# Patient Record
Sex: Female | Born: 1990 | Race: White | Hispanic: No | Marital: Married | Smoking: Former smoker
Health system: Southern US, Community
[De-identification: ages and names within clinical notes are randomized; demographics above are authoritative.]

## PROBLEM LIST (undated history)

## (undated) DIAGNOSIS — N914 Secondary oligomenorrhea: Secondary | ICD-10-CM

## (undated) DIAGNOSIS — N760 Acute vaginitis: Secondary | ICD-10-CM

## (undated) DIAGNOSIS — O24419 Gestational diabetes mellitus in pregnancy, unspecified control: Secondary | ICD-10-CM

## (undated) DIAGNOSIS — B9689 Other specified bacterial agents as the cause of diseases classified elsewhere: Secondary | ICD-10-CM

## (undated) HISTORY — DX: Gestational diabetes mellitus in pregnancy, unspecified control: O24.419

## (undated) HISTORY — PX: TONSILLECTOMY: SUR1361

## (undated) HISTORY — DX: Other specified bacterial agents as the cause of diseases classified elsewhere: B96.89

## (undated) HISTORY — DX: Other specified bacterial agents as the cause of diseases classified elsewhere: N76.0

## (undated) HISTORY — DX: Secondary oligomenorrhea: N91.4

---
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
## 2011-02-03 LAB — STREP A DNA PROBE

## 2011-02-03 LAB — RAPID STREP SCREEN (MED CTR MEBANE ONLY): Streptococcus, Group A Screen (Direct): NEGATIVE

## 2015-12-06 ENCOUNTER — Emergency Department (HOSPITAL_COMMUNITY)
Admission: EM | Admit: 2015-12-06 | Discharge: 2015-12-07 | Disposition: A | Discharge status: Home / Self Care | Payer: BLUE CROSS/BLUE SHIELD | Attending: Emergency Medicine | Admitting: Emergency Medicine

## 2015-12-06 ENCOUNTER — Encounter (HOSPITAL_COMMUNITY): Payer: Self-pay | Admitting: Emergency Medicine

## 2015-12-06 DIAGNOSIS — N390 Urinary tract infection, site not specified: Secondary | ICD-10-CM

## 2015-12-06 DIAGNOSIS — R55 Syncope and collapse: Secondary | ICD-10-CM | POA: Diagnosis not present

## 2015-12-06 DIAGNOSIS — Z5181 Encounter for therapeutic drug level monitoring: Secondary | ICD-10-CM | POA: Diagnosis not present

## 2015-12-06 LAB — URINALYSIS, ROUTINE W REFLEX MICROSCOPIC
BILIRUBIN URINE: NEGATIVE
GLUCOSE, UA: NEGATIVE mg/dL
KETONES UR: NEGATIVE mg/dL
LEUKOCYTES UA: NEGATIVE
Nitrite: POSITIVE — AB
PH: 6 (ref 5.0–8.0)
Protein, ur: NEGATIVE mg/dL
SPECIFIC GRAVITY, URINE: 1.025 (ref 1.005–1.030)

## 2015-12-06 LAB — BASIC METABOLIC PANEL
ANION GAP: 4 — AB (ref 5–15)
BUN: 15 mg/dL (ref 6–20)
CO2: 23 mmol/L (ref 22–32)
Calcium: 9 mg/dL (ref 8.9–10.3)
Chloride: 108 mmol/L (ref 101–111)
Creatinine, Ser: 0.77 mg/dL (ref 0.44–1.00)
GFR calc non Af Amer: >60 mL/min (ref >60)
Glucose, Bld: 94 mg/dL (ref 65–99)
Potassium: 3.5 mmol/L (ref 3.5–5.1)
SODIUM: 135 mmol/L (ref 135–145)

## 2015-12-06 LAB — RAPID URINE DRUG SCREEN, HOSP PERFORMED
AMPHETAMINES: NOT DETECTED
BENZODIAZEPINES: NOT DETECTED
Barbiturates: NOT DETECTED
Cocaine: NOT DETECTED
OPIATES: NOT DETECTED
TETRAHYDROCANNABINOL: POSITIVE — AB

## 2015-12-06 LAB — DIFFERENTIAL
Basophils Absolute: 0 10*3/uL (ref 0.0–0.1)
Basophils Relative: 0 %
EOS PCT: 1 %
Eosinophils Absolute: 0.1 10*3/uL (ref 0.0–0.7)
LYMPHS ABS: 2.7 10*3/uL (ref 0.7–4.0)
LYMPHS PCT: 34 %
Monocytes Absolute: 0.6 10*3/uL (ref 0.1–1.0)
Monocytes Relative: 8 %
NEUTROS ABS: 4.5 10*3/uL (ref 1.7–7.7)
NEUTROS PCT: 57 %

## 2015-12-06 LAB — URINE MICROSCOPIC-ADD ON

## 2015-12-06 LAB — CBC
HEMATOCRIT: 38.5 % (ref 36.0–46.0)
HEMOGLOBIN: 13 g/dL (ref 12.0–15.0)
MCH: 28.9 pg (ref 26.0–34.0)
MCHC: 33.8 g/dL (ref 30.0–36.0)
MCV: 85.6 fL (ref 78.0–100.0)
Platelets: 197 10*3/uL (ref 150–400)
RBC: 4.5 MIL/uL (ref 3.87–5.11)
RDW: 13.1 % (ref 11.5–15.5)
WBC: 8.4 10*3/uL (ref 4.0–10.5)

## 2015-12-06 LAB — ETHANOL

## 2015-12-06 LAB — CBG MONITORING, ED: Glucose-Capillary: 110 mg/dL — ABNORMAL HIGH (ref 65–99)

## 2015-12-06 LAB — POC URINE PREG, ED: Preg Test, Ur: NEGATIVE

## 2015-12-06 MED ORDER — SODIUM CHLORIDE 0.9 % IV BOLUS (SEPSIS)
1000.0000 mL | Freq: Once | INTRAVENOUS | Status: AC
  Administered 2015-12-06: 1000 mL via INTRAVENOUS

## 2015-12-06 MED ORDER — ONDANSETRON HCL 4 MG/2ML IJ SOLN
4.0000 mg | Freq: Once | INTRAMUSCULAR | Status: AC
  Administered 2015-12-06: 4 mg via INTRAVENOUS
  Filled 2015-12-06: qty 2

## 2015-12-06 NOTE — ED Provider Notes (Signed)
[facility] DEPT Provider Note   CSN: [id number] Arrival date & time: 12/06/15  2113  First Provider Contact:   First MD Initiated Contact with Patient 12/06/15 2212     By signing my name below, I, [name], attest that this documentation has been prepared under the direction and in the presence of [name], PA-C.  Electronically Signed: [name], Scribe. 12/06/2015 , 10:33 PM .   History   Chief Complaint Chief Complaint  Patient presents with  . Loss of Consciousness    HPI Katherine [name] is a 25 y.o. female. Patient is complaining of brief episode of "possible fainting" and dizziness which occurred today at 9:15PM. She states she was drinking a "mixed drink with vodka" with her friend and boyfriend. She suddenly felt dizzy after having a sip of the vodka. She continued to drink the mixed drink. She had to sit down and next thing she noticed was her friend and boyfriend standing above her. She believes she have "passed out" for few seconds. She drank a sip of water for some relief.  She denies vomiting, visual changes, vomiting, incontinence or hx of seizures. She denies any chronic medical problems. She denies any new medications. She states she has drank fluids today.  She admits to drinking socially about once a week. She smokes marijuana daily.  HPI  History reviewed. No pertinent past medical history.  There are no active problems to display for this patient.   History reviewed. No pertinent surgical history.  OB History    No data available       Home Medications    Prior to Admission medications   Not on File    Family History History reviewed. No pertinent family history.  Social History Social History  Substance Use Topics  . Smoking status: Never Smoker  . Smokeless tobacco: Never Used  . Alcohol use Yes     Allergies   Review of patient's allergies indicates no known allergies.   Review of Systems Review of Systems    Constitutional: Negative for chills, fatigue and fever.  Respiratory: Negative for chest tightness and shortness of breath.   Cardiovascular: Negative for chest pain.  Gastrointestinal: Negative for abdominal pain and vomiting.  Genitourinary: Negative for dysuria, menstrual problem, vaginal bleeding and vaginal discharge.  Musculoskeletal: Negative for arthralgias, neck pain and neck stiffness.  Skin: Negative for rash.  Neurological: Positive for dizziness, syncope and headaches. Negative for seizures, speech difficulty and weakness.  Psychiatric/Behavioral: Negative for confusion.     Physical Exam Updated Vital Signs BP 103/67   Pulse 67   Temp 98.2 F (36.8 C)   Resp 23   Ht  (1.6 m)   Wt 77.1 kg   LMP  (LMP Unknown)   SpO2 95%   BMI 30.11 kg/m   Physical Exam  Constitutional: She is oriented to person, place, and time. She appears well-developed and well-nourished. No distress.  HENT:  Head: Normocephalic and atraumatic.  Mouth/Throat: Uvula is midline and oropharynx is clear and moist. Mucous membranes are dry.  Eyes: Conjunctivae and EOM are normal. Pupils are equal, round, and reactive to light.  Neck: Neck supple. No thyromegaly present.  Cardiovascular: Normal rate, regular rhythm and normal heart sounds.   Pulmonary/Chest: Effort normal. No respiratory distress.  Abdominal: Soft. She exhibits no distension. There is no tenderness.  Musculoskeletal: Normal range of motion.  Neurological: She is alert and oriented to person, place, and time. No sensory deficit. She exhibits normal  muscle tone. Coordination normal. GCS eye subscore is 4. GCS verbal subscore is 5. GCS motor subscore is 6.  Skin: Skin is warm and dry.  Psychiatric: She has a normal mood and affect. Her behavior is normal. Thought content normal.  Nursing note and vitals reviewed.    ED Treatments / Results  Labs (all labs ordered are listed, but only abnormal results are displayed) Labs  Reviewed  BASIC METABOLIC PANEL - Abnormal; Notable for the following:       Result Value   Anion gap 4 (*)    All other components within normal limits  URINALYSIS, ROUTINE W REFLEX MICROSCOPIC (NOT AT [facility]) - Abnormal; Notable for the following:    Hgb urine dipstick SMALL (*)    Nitrite POSITIVE (*)    All other components within normal limits  URINE RAPID DRUG SCREEN, HOSP PERFORMED - Abnormal; Notable for the following:    Tetrahydrocannabinol POSITIVE (*)    All other components within normal limits  URINE MICROSCOPIC-ADD ON - Abnormal; Notable for the following:    Squamous Epithelial / LPF 0-5 (*)    Bacteria, UA MANY (*)    All other components within normal limits  CBG MONITORING, ED - Abnormal; Notable for the following:    Glucose-Capillary 110 (*)    All other components within normal limits  URINE CULTURE  CBC  DIFFERENTIAL  ETHANOL  POC URINE PREG, ED    EKG  EKG Interpretation  Date/Time:  Monday December 06 2015 22:48:17 EDT Ventricular Rate:  78 PR Interval:    QRS Duration: 102 QT Interval:  395 QTC Calculation: 450 R Axis:   93 Text Interpretation:  Sinus rhythm Borderline right axis deviation No significant change was found Since last tracing of earlier today Confirmed by [facility]  MD, Nicholos Johns ([id number]) on 12/06/2015 11:07:24 PM       Radiology No results found.  Procedures Procedures (including critical care time)  Medications Ordered in ED Medications  sodium chloride 0.9 % bolus 1,000 mL (0 mLs Intravenous Stopped 12/06/15 2327)  ondansetron (ZOFRAN) injection 4 mg (4 mg Intravenous Given 12/06/15 2249)     Initial Impression / Assessment and Plan / ED Course  I have reviewed the triage vital signs and the nursing notes.  Pertinent labs & imaging results that were available during my care of the patient were reviewed by me and considered in my medical decision making (see chart for details).  Clinical Course   Pt is well appearing, texting  on her cell phone.  Drinking fluids without difficulty and eating a snack.  Friends at bedside.   Labs reassuring, U/A shows possible UTI.  Urine culture pending.  On recheck, pt is feeling better and requesting discharge.  I will prescribe Keflex for possible UTI.  Doubt seizure.  Appears stable for d/c   Final Clinical Impressions(s) / ED Diagnoses   Final diagnoses:  None    New Prescriptions New Prescriptions   No medications on file     I personally performed the services described in this documentation, which was scribed in my presence. The recorded information has been reviewed and is accurate.      Pauline Aus, PA-C 12/07/15 0004    Samuel Jester, DO 12/09/15 1517

## 2015-12-06 NOTE — ED Triage Notes (Signed)
Pt states she had syncopal episode with dizziness and sweating. Pt states she had 2 shots of liquor and smoke thc.

## 2015-12-07 MED ORDER — CEPHALEXIN 500 MG PO CAPS: 500.0000 mg | ORAL_CAPSULE | Freq: Four times a day (QID) | ORAL | 0 refills | Status: DC

## 2015-12-07 NOTE — Discharge Instructions (Signed)
Drink plenty of water.  Antibiotic as directed.  Follow-up with your doctor or return here for any worsening symptoms

## 2015-12-09 LAB — URINE CULTURE: Culture: >=100000 — AB

## 2015-12-10 ENCOUNTER — Telehealth (HOSPITAL_BASED_OUTPATIENT_CLINIC_OR_DEPARTMENT_OTHER): Payer: Self-pay | Admitting: *Deleted

## 2015-12-10 NOTE — Telephone Encounter (Signed)
Post ED Visit - Positive Culture Follow-up  Culture report reviewed by antimicrobial stewardship pharmacist:  []  Enzo Bi, Pharm.D. [x]  Celedonio Miyamoto, [address].D., BCPS []  Garvin Fila, Pharm.D. []  Georgina Pillion, Pharm.D., BCPS []  [place], [address].D., BCPS, AAHIVP []  Estella Husk, Pharm.D., BCPS, AAHIVP []  Tennis Must, Pharm.D. []  Sherle Poe, [place].D.  Positive Escherichia Coli urine culture Treated with cephalexin organism sensitive to the same and no further patient follow-up is required at this time.  Katera Rybka, [place] Boos 12/10/2015, 2:32 PM

## 2016-11-24 ENCOUNTER — Encounter: Payer: Self-pay | Admitting: Adult Health

## 2016-11-24 ENCOUNTER — Other Ambulatory Visit: Payer: Self-pay | Admitting: Obstetrics & Gynecology

## 2016-11-24 DIAGNOSIS — O3680X0 Pregnancy with inconclusive fetal viability, not applicable or unspecified: Secondary | ICD-10-CM

## 2016-11-27 ENCOUNTER — Telehealth: Payer: Self-pay | Admitting: *Deleted

## 2016-11-27 ENCOUNTER — Ambulatory Visit (INDEPENDENT_AMBULATORY_CARE_PROVIDER_SITE_OTHER): Payer: 59

## 2016-11-27 DIAGNOSIS — O3680X0 Pregnancy with inconclusive fetal viability, not applicable or unspecified: Secondary | ICD-10-CM | POA: Diagnosis not present

## 2016-11-27 NOTE — Telephone Encounter (Signed)
LMOVM to return call.

## 2016-11-27 NOTE — Progress Notes (Signed)
US 7+3 wks,single IUP w/ys,pos fht 150 bpm,normal ovaries bilat,crl 12.57 mm,EDD 07/13/2017

## 2016-12-06 ENCOUNTER — Telehealth: Payer: Self-pay | Admitting: Women's Health

## 2016-12-06 NOTE — Telephone Encounter (Signed)
Patient states she is unsure if she may have taken 25mg  of Clomiphene Citrate prescribed for her husband because the medications were sitting next to each other. Patient is concerned. Please advise.

## 2016-12-06 NOTE — Telephone Encounter (Signed)
Should not be a problem Reassure pateient

## 2016-12-06 NOTE — Telephone Encounter (Signed)
Patient called stating that she has accidentally taken half of her husbands medication. Pt is pregnant, pt would like to know if she will be okay. Please contact pt

## 2016-12-07 NOTE — Telephone Encounter (Signed)
Informed patient that per Dr Despina HiddenEure it should not be a problem that she took medication. Verbalized understanding.

## 2016-12-11 ENCOUNTER — Encounter: Payer: Self-pay | Admitting: Women's Health

## 2016-12-11 ENCOUNTER — Ambulatory Visit (INDEPENDENT_AMBULATORY_CARE_PROVIDER_SITE_OTHER): Payer: 59 | Admitting: Women's Health

## 2016-12-11 ENCOUNTER — Other Ambulatory Visit (HOSPITAL_COMMUNITY)
Admission: RE | Admit: 2016-12-11 | Discharge: 2016-12-11 | Disposition: A | Discharge status: Home / Self Care | Payer: Managed Care, Other (non HMO) | Source: Ambulatory Visit | Attending: Obstetrics & Gynecology | Admitting: Obstetrics & Gynecology

## 2016-12-11 VITALS — BP 102/50 | HR 78 | Wt 152.0 lb

## 2016-12-11 DIAGNOSIS — Z1389 Encounter for screening for other disorder: Secondary | ICD-10-CM

## 2016-12-11 DIAGNOSIS — Z3400 Encounter for supervision of normal first pregnancy, unspecified trimester: Secondary | ICD-10-CM | POA: Insufficient documentation

## 2016-12-11 DIAGNOSIS — Z124 Encounter for screening for malignant neoplasm of cervix: Secondary | ICD-10-CM | POA: Insufficient documentation

## 2016-12-11 DIAGNOSIS — Z3401 Encounter for supervision of normal first pregnancy, first trimester: Secondary | ICD-10-CM

## 2016-12-11 DIAGNOSIS — O99321 Drug use complicating pregnancy, first trimester: Secondary | ICD-10-CM

## 2016-12-11 DIAGNOSIS — Z3A09 9 weeks gestation of pregnancy: Secondary | ICD-10-CM

## 2016-12-11 DIAGNOSIS — Z3682 Encounter for antenatal screening for nuchal translucency: Secondary | ICD-10-CM

## 2016-12-11 DIAGNOSIS — Z331 Pregnant state, incidental: Secondary | ICD-10-CM

## 2016-12-11 DIAGNOSIS — F1290 Cannabis use, unspecified, uncomplicated: Secondary | ICD-10-CM | POA: Insufficient documentation

## 2016-12-11 LAB — POCT URINALYSIS DIPSTICK
Blood, UA: NEGATIVE
Glucose, UA: NEGATIVE
Ketones, UA: NEGATIVE
LEUKOCYTES UA: NEGATIVE
NITRITE UA: NEGATIVE
Protein, UA: NEGATIVE

## 2016-12-11 NOTE — Progress Notes (Signed)
  Subjective:  Katherine [name] is a 26 y.o. G1P0 Caucasian female at 3323w3d by LMP c/w 7wk u/s, being seen today for her first obstetrical visit.  Her obstetrical history is significant for primigravida; THC and etoh use prior to +PT, none since.  Pregnancy history fully reviewed.  Patient reports some nausea, started vit b6 & unsiom which helps. Denies vb, cramping, uti s/s, abnormal/malodorous vag d/c, or vulvovaginal itching/irritation.  BP (!) 102/50   Pulse 78   Wt 152 lb (68.9 kg)   LMP 10/06/2016 (Exact Date)   BMI 26.93 kg/m   HISTORY: OB History  Gravida Para Term Preterm AB Living  1            SAB TAB Ectopic Multiple Live Births               # Outcome Date GA Lbr Len/2nd Weight Sex Delivery Anes PTL Lv  1 Current              Past Medical History:  Diagnosis Date  . Bacterial vaginosis    Past Surgical History:  Procedure Laterality Date  . TONSILLECTOMY     Family History  Problem Relation Age of Onset  . Diabetes Mother   . Vision loss Father   . Asthma Brother   . ADD / ADHD Brother   . Drug abuse Brother   . Seizures Maternal Uncle   . Heart disease Paternal Aunt   . Heart attack Maternal Grandmother   . Alcohol abuse Paternal Grandfather   . Mental retardation Cousin     Exam   System:     General: Well developed & nourished, no acute distress   Skin: Warm & dry, normal coloration and turgor, no rashes   Neurologic: Alert & oriented, normal mood   Cardiovascular: Regular rate & rhythm   Respiratory: Effort & rate normal, LCTAB, acyanotic   Abdomen: Soft, non tender   Extremities: normal strength, tone   Pelvic Exam:    Perineum: Normal perineum   Vulva: Normal, no lesions   Vagina:  Normal mucosa, normal discharge   Cervix: Normal, bulbous, appears closed   Uterus: Normal size/shape/contour for GA   Thin prep pap smear obtained w/ reflex high risk HPV cotesting FHR: + via informal transabdominal u/s, active fetus   Assessment:    Pregnancy: G1P0 Patient Active Problem List   Diagnosis Date Noted  . Supervision of normal first pregnancy 12/11/2016  . Marijuana use 12/11/2016    8323w3d G1P0 New OB visit THC/etoh use prior to +PT  Plan:  Initial labs obtained Continue prenatal vitamins Problem list reviewed and updated Reviewed n/v relief measures and warning s/s to report Reviewed recommended weight gain based on pre-gravid BMI Encouraged well-balanced diet Genetic Screening discussed Integrated Screen: wants, but hasn't checked w/ insurance yet- may cancel if not covered or doesn't qualify for preg mcaid Cystic fibrosis screening discussed declined Ultrasound discussed; fetal survey: requested Follow up in 4 weeks for visit and 1st IT/NT  CCNC completed, has applied, waiting to see if she qualifies  Marge DuncansBooker, Taym Twist Randall CNM, [facility]NP[facility] 12/11/2016 2:53 PM

## 2016-12-11 NOTE — Patient Instructions (Signed)

## 2016-12-12 LAB — URINALYSIS, ROUTINE W REFLEX MICROSCOPIC
Bilirubin, UA: NEGATIVE
GLUCOSE, UA: NEGATIVE
Ketones, UA: NEGATIVE
Leukocytes, UA: NEGATIVE
NITRITE UA: NEGATIVE
PH UA: 7.5 (ref 5.0–7.5)
Protein, UA: NEGATIVE
RBC, UA: NEGATIVE
Specific Gravity, UA: 1.026 (ref 1.005–1.030)
UUROB: 0.2 mg/dL (ref 0.2–1.0)

## 2016-12-12 LAB — VARICELLA ZOSTER ANTIBODY, IGG

## 2016-12-12 LAB — CBC
HEMATOCRIT: 37.3 % (ref 34.0–46.6)
Hemoglobin: 12.5 g/dL (ref 11.1–15.9)
MCH: 28.3 pg (ref 26.6–33.0)
MCHC: 33.5 g/dL (ref 31.5–35.7)
MCV: 84 fL (ref 79–97)
PLATELETS: 252 10*3/uL (ref 150–379)
RBC: 4.42 x10E6/uL (ref 3.77–5.28)
RDW: 13.8 % (ref 12.3–15.4)
WBC: 10.3 10*3/uL (ref 3.4–10.8)

## 2016-12-12 LAB — PMP SCREEN PROFILE (10S), URINE
AMPHETAMINE SCREEN URINE: NEGATIVE ng/mL
BARBITURATE SCREEN URINE: NEGATIVE ng/mL
BENZODIAZEPINE SCREEN, URINE: NEGATIVE ng/mL
CANNABINOIDS UR QL SCN: POSITIVE ng/mL — AB
COCAINE(METAB.)SCREEN, URINE: NEGATIVE ng/mL
Creatinine(Crt), U: 152.3 mg/dL (ref 20.0–300.0)
Methadone Screen, Urine: NEGATIVE ng/mL
OPIATE SCREEN URINE: NEGATIVE ng/mL
OXYCODONE+OXYMORPHONE UR QL SCN: NEGATIVE ng/mL
PHENCYCLIDINE QUANTITATIVE URINE: NEGATIVE ng/mL
Ph of Urine: 7.3 (ref 4.5–8.9)
Propoxyphene Scrn, Ur: NEGATIVE ng/mL

## 2016-12-12 LAB — RPR: RPR: NONREACTIVE

## 2016-12-12 LAB — HEPATITIS B SURFACE ANTIGEN: Hepatitis B Surface Ag: NEGATIVE

## 2016-12-12 LAB — ABO/RH: Rh Factor: POSITIVE

## 2016-12-12 LAB — RUBELLA SCREEN: RUBELLA: 2.81 {index} (ref >0.99)

## 2016-12-12 LAB — HIV ANTIBODY (ROUTINE TESTING W REFLEX): HIV Screen 4th Generation wRfx: NONREACTIVE

## 2016-12-12 LAB — ANTIBODY SCREEN: Antibody Screen: NEGATIVE

## 2016-12-13 ENCOUNTER — Encounter: Payer: Self-pay | Admitting: Women's Health

## 2016-12-13 DIAGNOSIS — Z283 Underimmunization status: Secondary | ICD-10-CM

## 2016-12-13 DIAGNOSIS — O09899 Supervision of other high risk pregnancies, unspecified trimester: Secondary | ICD-10-CM | POA: Insufficient documentation

## 2016-12-13 LAB — CYTOLOGY - PAP
Chlamydia: NEGATIVE
Diagnosis: NEGATIVE
Neisseria Gonorrhea: NEGATIVE

## 2016-12-13 LAB — URINE CULTURE

## 2017-01-01 ENCOUNTER — Ambulatory Visit (INDEPENDENT_AMBULATORY_CARE_PROVIDER_SITE_OTHER): Payer: 59

## 2017-01-01 ENCOUNTER — Encounter: Payer: Self-pay | Admitting: Obstetrics & Gynecology

## 2017-01-01 ENCOUNTER — Ambulatory Visit (INDEPENDENT_AMBULATORY_CARE_PROVIDER_SITE_OTHER): Payer: 59 | Admitting: Obstetrics & Gynecology

## 2017-01-01 VITALS — BP 100/56 | HR 79 | Wt 151.5 lb

## 2017-01-01 DIAGNOSIS — F1290 Cannabis use, unspecified, uncomplicated: Secondary | ICD-10-CM

## 2017-01-01 DIAGNOSIS — Z3682 Encounter for antenatal screening for nuchal translucency: Secondary | ICD-10-CM | POA: Diagnosis not present

## 2017-01-01 DIAGNOSIS — Z3401 Encounter for supervision of normal first pregnancy, first trimester: Secondary | ICD-10-CM

## 2017-01-01 DIAGNOSIS — Z331 Pregnant state, incidental: Secondary | ICD-10-CM

## 2017-01-01 DIAGNOSIS — Z1389 Encounter for screening for other disorder: Secondary | ICD-10-CM

## 2017-01-01 DIAGNOSIS — Z3A12 12 weeks gestation of pregnancy: Secondary | ICD-10-CM

## 2017-01-01 LAB — POCT URINALYSIS DIPSTICK
GLUCOSE UA: NEGATIVE
KETONES UA: NEGATIVE
Leukocytes, UA: NEGATIVE
Nitrite, UA: NEGATIVE
Protein, UA: NEGATIVE

## 2017-01-01 NOTE — Progress Notes (Signed)
G1P0 [redacted]w[redacted]d Estimated Date of Delivery: 07/13/17  Blood pressure (!) 100/56, pulse 79, weight 151 lb 8 oz (68.7 kg), last menstrual period 10/06/2016.   BP weight and urine results all reviewed and noted.  Please refer to the obstetrical flow sheet for the fundal height and fetal heart rate documentation:  Patient reports good fetal movement, denies any bleeding and no rupture of membranes symptoms or regular contractions. Patient is without complaints. All questions were answered.  Orders Placed This Encounter  Procedures  . Maternal Screen, Integrated #1  . POCT Urinalysis Dipstick    Plan:  Continued routine obstetrical care, NT scan is normal, first IT today  Return in about 4 weeks (around 01/29/2017) for 2nd IT, , LROB.

## 2017-01-01 NOTE — Progress Notes (Signed)
[place] 12+3 wks,measurements c/w dates,normal ovaries bilat,crl 64.08 mm,NB present, NT 1.6 mm,fhr 166,post pl gr 0

## 2017-01-03 LAB — MATERNAL SCREEN, INTEGRATED #1
Crown Rump Length: 64 mm
Gest. Age on Collection Date: 12.7 weeks
MATERNAL AGE AT EDD: 25.5 a
NUCHAL TRANSLUCENCY (NT): 1.6 mm
NUMBER OF FETUSES: 1
PAPP-A VALUE: 829.8 ng/mL
Weight: 152 [lb_av]

## 2017-01-16 ENCOUNTER — Telehealth: Payer: Self-pay | Admitting: *Deleted

## 2017-01-16 NOTE — Telephone Encounter (Signed)
Patient called with complaints of a headache for several days now. She has tried cool compresses, tylenol, small frequent meals, lots of water and a Pepsi with no relief. Advised patient to try CoQ10, Magnesium Oxide and Vitamin B2 and let us know if that doesn't help. Also recommended Claritin or Zyrtec if allergy related. Verbalized understanding.

## 2017-01-29 ENCOUNTER — Ambulatory Visit (INDEPENDENT_AMBULATORY_CARE_PROVIDER_SITE_OTHER): Payer: 59 | Admitting: Women's Health

## 2017-01-29 ENCOUNTER — Encounter: Payer: Self-pay | Admitting: Women's Health

## 2017-01-29 VITALS — BP 100/58 | HR 86 | Wt 159.0 lb

## 2017-01-29 DIAGNOSIS — Z1389 Encounter for screening for other disorder: Secondary | ICD-10-CM

## 2017-01-29 DIAGNOSIS — Z331 Pregnant state, incidental: Secondary | ICD-10-CM

## 2017-01-29 DIAGNOSIS — Z3A16 16 weeks gestation of pregnancy: Secondary | ICD-10-CM

## 2017-01-29 DIAGNOSIS — Z3402 Encounter for supervision of normal first pregnancy, second trimester: Secondary | ICD-10-CM

## 2017-01-29 DIAGNOSIS — Z363 Encounter for antenatal screening for malformations: Secondary | ICD-10-CM

## 2017-01-29 DIAGNOSIS — Z23 Encounter for immunization: Secondary | ICD-10-CM | POA: Diagnosis not present

## 2017-01-29 DIAGNOSIS — Z1379 Encounter for other screening for genetic and chromosomal anomalies: Secondary | ICD-10-CM

## 2017-01-29 LAB — POCT URINALYSIS DIPSTICK
Blood, UA: NEGATIVE
GLUCOSE UA: NEGATIVE
Ketones, UA: NEGATIVE
LEUKOCYTES UA: NEGATIVE
NITRITE UA: NEGATIVE
Protein, UA: NEGATIVE

## 2017-01-29 MED ORDER — PRENATAL PLUS 27-1 MG PO TABS: 1.0000 | ORAL_TABLET | Freq: Every day | ORAL | 12 refills | Status: DC

## 2017-01-29 NOTE — Progress Notes (Signed)
   [facility] Low-Risk Pregnancy Visit  Patient name: Katherine [name] MRN [id number]  Date of birth: 1990-11-02 CC & HPI:  Katherine [name] is a 26 y.o. G1P0 female at [redacted]w[redacted]d with an Estimated Date of Delivery: 07/13/17 being seen today for ongoing management of a low-risk pregnancy.   Today she reports was doing laser hair removal prior to pregnancy, now hair growing back-wants to know what she can do. Requests prenatal vitamin rx 'getting too expensive' getting otc  Review of Systems:   Reports no fm yet.    Denies regular uc's, lof, vb, or uti s/s.   Pertinent History Reviewed:  Reviewed past medical,surgical and family history.  Reviewed problem list, medications and allergies.  Objective Findings:   Vitals:   01/29/17 1128  BP: (!) 100/58  Pulse: 86  Weight: 159 lb (72.1 kg)    Body mass index is 28.17 kg/m.  Fundal height: 16wks Fetal heart rate: 145  Results for orders placed or performed in visit on 01/29/17 (from the past 24 hour(s))  POCT urinalysis dipstick   Collection Time: 01/29/17 11:30 AM  Result Value Ref Range   Color, UA     Clarity, UA     Glucose, UA neg    Bilirubin, UA     Ketones, UA neg    Spec Grav, UA  1.010 - 1.025   Blood, UA neg    pH, UA  5.0 - 8.0   Protein, UA neg    Urobilinogen, UA  0.2 or 1.0 E.U./dL   Nitrite, UA neg    Leukocytes, UA Negative Negative     Assessment & Plan:   1) Low-risk pregnancy G1P0 at [redacted]w[redacted]d with an Estimated Date of Delivery: 07/13/17  2) Hair growth- nothing can be done during pregnancy other than shaving/trimming  Reviewed: warning s/s to report. All questions were answered  Plan:  Continue routine obstetrical care  Rx pnv sent today Flu shot and 2nd IT today  Return in about 2 weeks (around 02/12/2017) for LROB, MV:HQIONGE.  Orders Placed This Encounter  Procedures  . US OB Comp + 14 Wk  . INTEGRATED 2  . POCT urinalysis dipstick    Marge Duncans CNM, [facility] 01/29/2017 11:45 AM

## 2017-01-29 NOTE — Patient Instructions (Signed)

## 2017-01-31 LAB — INTEGRATED 2
AFP MARKER: 23.5 ng/mL
AFP MOM: 0.72
CROWN RUMP LENGTH: 64 mm
DIA MoM: 1.01
DIA Value: 161.1 pg/mL
ESTRIOL UNCONJUGATED: 1.18 ng/mL
GEST. AGE ON COLLECTION DATE: 12.7 wk
GESTATIONAL AGE: 16.7 wk
HCG MOM: 1.58
Maternal Age at EDD: 25.5 yr
Nuchal Translucency (NT): 1.6 mm
Nuchal Translucency MoM: 0.99
Number of Fetuses: 1
PAPP-A MoM: 0.83
PAPP-A Value: 829.8 ng/mL
TEST RESULTS: NEGATIVE
WEIGHT: 159 [lb_av]
Weight: 152 [lb_av]
hCG Value: 44.7 IU/mL
uE3 MoM: 1.34

## 2017-02-12 ENCOUNTER — Ambulatory Visit (INDEPENDENT_AMBULATORY_CARE_PROVIDER_SITE_OTHER): Payer: 59

## 2017-02-12 ENCOUNTER — Encounter: Payer: Self-pay | Admitting: Women's Health

## 2017-02-12 ENCOUNTER — Ambulatory Visit (INDEPENDENT_AMBULATORY_CARE_PROVIDER_SITE_OTHER): Payer: 59 | Admitting: Women's Health

## 2017-02-12 VITALS — BP 90/60 | HR 72 | Temp 98.1°F | Wt 160.0 lb

## 2017-02-12 DIAGNOSIS — Z363 Encounter for antenatal screening for malformations: Secondary | ICD-10-CM | POA: Diagnosis not present

## 2017-02-12 DIAGNOSIS — O288 Other abnormal findings on antenatal screening of mother: Secondary | ICD-10-CM

## 2017-02-12 DIAGNOSIS — Z3A18 18 weeks gestation of pregnancy: Secondary | ICD-10-CM

## 2017-02-12 DIAGNOSIS — R8271 Bacteriuria: Secondary | ICD-10-CM | POA: Insufficient documentation

## 2017-02-12 DIAGNOSIS — O9989 Other specified diseases and conditions complicating pregnancy, childbirth and the puerperium: Secondary | ICD-10-CM

## 2017-02-12 DIAGNOSIS — Z331 Pregnant state, incidental: Secondary | ICD-10-CM

## 2017-02-12 DIAGNOSIS — F1290 Cannabis use, unspecified, uncomplicated: Secondary | ICD-10-CM

## 2017-02-12 DIAGNOSIS — Z3402 Encounter for supervision of normal first pregnancy, second trimester: Secondary | ICD-10-CM

## 2017-02-12 DIAGNOSIS — Z1389 Encounter for screening for other disorder: Secondary | ICD-10-CM

## 2017-02-12 DIAGNOSIS — R8290 Unspecified abnormal findings in urine: Secondary | ICD-10-CM

## 2017-02-12 DIAGNOSIS — O99891 Other specified diseases and conditions complicating pregnancy: Secondary | ICD-10-CM

## 2017-02-12 LAB — POCT URINALYSIS DIPSTICK
Blood, UA: NEGATIVE
Glucose, UA: NEGATIVE
Ketones, UA: NEGATIVE
LEUKOCYTES UA: NEGATIVE

## 2017-02-12 MED ORDER — NITROFURANTOIN MONOHYD MACRO 100 MG PO CAPS: 100.0000 mg | ORAL_CAPSULE | Freq: Two times a day (BID) | ORAL | 0 refills | Status: DC

## 2017-02-12 NOTE — Progress Notes (Signed)
[place] 18+3 wks,breech,post pl gr 0,normal ovaries bilat, cx 4.5 cm,svp of fluid 4.1 cm,fhr 152 bpm,efw 253 g,anatomy complete,no obvious abnormalities

## 2017-02-12 NOTE — Patient Instructions (Addendum)
[place] Pediatricians/Family Doctors:  [facility] [phone]            [facility] [phone]                 [facility] [phone] (usually not accepting new patients unless you have family there already, you are always welcome to call and ask)       [facility] [phone]       [place] Pediatricians/Family Doctors:   [facility]: [phone]  [facility]: [phone]   Second Trimester of Pregnancy The second trimester is from week 14 through week 27 (months 4 through 6). The second trimester is often a time when you feel your best. Your body has adjusted to being pregnant, and you begin to feel better physically. Usually, morning sickness has lessened or quit completely, you may have more energy, and you may have an increase in appetite. The second trimester is also a time when the fetus is growing rapidly. At the end of the sixth month, the fetus is about 9 inches long and weighs about 1 pounds. You will likely begin to feel the baby move (quickening) between 16 and 20 weeks of pregnancy. Body changes during your second trimester Your body continues to go through many changes during your second trimester. The changes vary from woman to woman.  Your weight will continue to increase. You will notice your lower abdomen bulging out.  You may begin to get stretch marks on your hips, abdomen, and breasts.  You may develop headaches that can be relieved by medicines. The medicines should be approved by your health care provider.  You may urinate more often because the fetus is pressing on your bladder.  You may develop or continue to have heartburn as a result of your pregnancy.  You may develop constipation because certain hormones are causing the muscles that push waste through your intestines to slow down.  You may develop hemorrhoids or swollen, bulging veins (varicose  veins).  You may have back pain. This is caused by: ? Weight gain. ? Pregnancy hormones that are relaxing the joints in your pelvis. ? A shift in weight and the muscles that support your balance.  Your breasts will continue to grow and they will continue to become tender.  Your gums may bleed and may be sensitive to brushing and flossing.  Dark spots or blotches (chloasma, mask of pregnancy) may develop on your face. This will likely fade after the baby is born.  A dark line from your belly button to the pubic area (linea nigra) may appear. This will likely fade after the baby is born.  You may have changes in your hair. These can include thickening of your hair, rapid growth, and changes in texture. Some women also have hair loss during or after pregnancy, or hair that feels dry or thin. Your hair will most likely return to normal after your baby is born.  What to expect at prenatal visits During a routine prenatal visit:  You will be weighed to make sure you and the fetus are growing normally.  Your blood pressure will be taken.  Your abdomen will be measured to track your baby's growth.  The fetal heartbeat will be listened to.  Any test results from the previous visit will be discussed.  Your health care provider may ask you:  How you are feeling.  If you are feeling the baby move.  If you have had any abnormal symptoms, such   as leaking fluid, bleeding, severe headaches, or abdominal cramping.  If you are using any tobacco products, including cigarettes, chewing tobacco, and electronic cigarettes.  If you have any questions.  Other tests that may be performed during your second trimester include:  Blood tests that check for: ? Low iron levels (anemia). ? High blood sugar that affects pregnant women (gestational diabetes) between 51 and 28 weeks. ? Rh antibodies. This is to check for a protein on red blood cells (Rh factor).  Urine tests to check for infections,  diabetes, or protein in the urine.  An ultrasound to confirm the proper growth and development of the baby.  An amniocentesis to check for possible genetic problems.  Fetal screens for spina bifida and Down syndrome.  HIV (human immunodeficiency virus) testing. Routine prenatal testing includes screening for HIV, unless you choose not to have this test.  Follow these instructions at home: Medicines  Follow your health care provider's instructions regarding medicine use. Specific medicines may be either safe or unsafe to take during pregnancy.  Take a prenatal vitamin that contains at least 600 micrograms (mcg) of folic acid.  If you develop constipation, try taking a stool softener if your health care provider approves. Eating and drinking  Eat a balanced diet that includes fresh fruits and vegetables, whole grains, good sources of protein such as meat, eggs, or tofu, and low-fat dairy. Your health care provider will help you determine the amount of weight gain that is right for you.  Avoid raw meat and uncooked cheese. These carry germs that can cause birth defects in the baby.  If you have low calcium intake from food, talk to your health care provider about whether you should take a daily calcium supplement.  Limit foods that are high in fat and processed sugars, such as fried and sweet foods.  To prevent constipation: ? Drink enough fluid to keep your urine clear or pale yellow. ? Eat foods that are high in fiber, such as fresh fruits and vegetables, whole grains, and beans. Activity  Exercise only as directed by your health care provider. Most women can continue their usual exercise routine during pregnancy. Try to exercise for 30 minutes at least 5 days a week. Stop exercising if you experience uterine contractions.  Avoid heavy lifting, wear low heel shoes, and practice good posture.  A sexual relationship may be continued unless your health care provider directs you  otherwise. Relieving pain and discomfort  Wear a good support bra to prevent discomfort from breast tenderness.  Take warm sitz baths to soothe any pain or discomfort caused by hemorrhoids. Use hemorrhoid cream if your health care provider approves.  Rest with your legs elevated if you have leg cramps or low back pain.  If you develop varicose veins, wear support hose. Elevate your feet for 15 minutes, 3-4 times a day. Limit salt in your diet. Prenatal Care  Write down your questions. Take them to your prenatal visits.  Keep all your prenatal visits as told by your health care provider. This is important. Safety  Wear your seat belt at all times when driving.  Make a list of emergency phone numbers, including numbers for family, friends, the hospital, and police and fire departments. General instructions  Ask your health care provider for a referral to a local prenatal education class. Begin classes no later than the beginning of month 6 of your pregnancy.  Ask for help if you have counseling or nutritional needs during pregnancy. Your  health care provider can offer advice or refer you to specialists for help with various needs.  Do not use hot tubs, steam rooms, or saunas.  Do not douche or use tampons or scented sanitary pads.  Do not cross your legs for long periods of time.  Avoid cat litter boxes and soil used by cats. These carry germs that can cause birth defects in the baby and possibly loss of the fetus by miscarriage or stillbirth.  Avoid all smoking, herbs, alcohol, and unprescribed drugs. Chemicals in these products can affect the formation and growth of the baby.  Do not use any products that contain nicotine or tobacco, such as cigarettes and e-cigarettes. If you need help quitting, ask your health care provider.  Visit your dentist if you have not gone yet during your pregnancy. Use a soft toothbrush to brush your teeth and be gentle when you floss. Contact a  health care provider if:  You have dizziness.  You have mild pelvic cramps, pelvic pressure, or nagging pain in the abdominal area.  You have persistent nausea, vomiting, or diarrhea.  You have a bad smelling vaginal discharge.  You have pain when you urinate. Get help right away if:  You have a fever.  You are leaking fluid from your vagina.  You have spotting or bleeding from your vagina.  You have severe abdominal cramping or pain.  You have rapid weight gain or weight loss.  You have shortness of breath with chest pain.  You notice sudden or extreme swelling of your face, hands, ankles, feet, or legs.  You have not felt your baby move in over an hour.  You have severe headaches that do not go away when you take medicine.  You have vision changes. Summary  The second trimester is from week 14 through week 27 (months 4 through 6). It is also a time when the fetus is growing rapidly.  Your body goes through many changes during pregnancy. The changes vary from woman to woman.  Avoid all smoking, herbs, alcohol, and unprescribed drugs. These chemicals affect the formation and growth your baby.  Do not use any tobacco products, such as cigarettes, chewing tobacco, and e-cigarettes. If you need help quitting, ask your health care provider.  Contact your health care provider if you have any questions. Keep all prenatal visits as told by your health care provider. This is important. This information is not intended to replace advice given to you by your health care provider. Make sure you discuss any questions you have with your health care provider. Document Released: 04/18/2001 Document Revised: 09/30/2015 Document Reviewed: 06/25/2012 Elsevier Interactive Patient Education  2017 [employer].

## 2017-02-12 NOTE — Progress Notes (Signed)
   [facility] Low-Risk Pregnancy Visit  Patient name: Katherine [name] MRN [id number]  Date of birth: [birth year] CC & HPI:  Katherine [name] is a 26 y.o. G1P0 female at [redacted]w[redacted]d with an Estimated Date of Delivery: 07/13/17 being seen today for ongoing management of a low-risk pregnancy.  Today she reports no complaints. Denies uti s/s Review of Systems:   Reports some intermittent fm.   Denies regular contractions, leakage of fluid, vaginal bleeding, abnormal vaginal discharge w/ itching/odor/irritation, headaches, visual changes, shortness of breath, chest pain, abdominal pain, severe nausea/vomiting, or problems with urination or bowel movements unless otherwise stated above. Pertinent History Reviewed:  Reviewed past medical,surgical, social and family history.  Reviewed problem list, medications and allergies. Objective Findings:   Vitals:   02/12/17 1445  BP: 90/60  Pulse: 72  Temp: 98.1 F (36.7 C)  Weight: 160 lb (72.6 kg)    Body mass index is 28.34 kg/m.  Fundal height: 18wks Fetal heart rate: 152 u/s SVE: n/a Today's anatomy u/s: [place] 18+3 wks,breech,post pl gr 0,normal ovaries bilat, cx 4.5 cm,svp of fluid 4.1 cm,fhr 152 bpm,efw 253 g,anatomy complete,no obvious abnormalities   Results for orders placed or performed in visit on 02/12/17 (from the past 24 hour(s))  POCT urinalysis dipstick   Collection Time: 02/12/17  2:59 PM  Result Value Ref Range   Color, UA     Clarity, UA     Glucose, UA neg    Bilirubin, UA     Ketones, UA neg    Spec Grav, UA  1.010 - 1.025   Blood, UA neg    pH, UA  5.0 - 8.0   Protein, UA trace    Urobilinogen, UA  0.2 or 1.0 E.U./dL   Nitrite, UA postive    Leukocytes, UA Negative Negative    Assessment & Plan:  1) Low-risk pregnancy G1P0 at [redacted]w[redacted]d with an Estimated Date of Delivery: 07/13/17  2) Asymptomatic bacteruria>rx macrobid bid x 7d, send urine cx  Reviewed: warning s/s to report, fm. All questions were answered Plan:  Continue  routine obstetrical care   Return in about 4 weeks (around 03/12/2017) for LROB.  Orders Placed This Encounter  Procedures  . Urine Culture  . POCT urinalysis dipstick   Marge Duncans CNM, [facility] 02/12/2017 3:11PM

## 2017-02-13 ENCOUNTER — Encounter: Payer: 59 | Admitting: Advanced Practice Midwife

## 2017-02-14 LAB — URINE CULTURE

## 2017-03-12 ENCOUNTER — Encounter: Payer: Self-pay | Admitting: Obstetrics and Gynecology

## 2017-03-12 ENCOUNTER — Ambulatory Visit (INDEPENDENT_AMBULATORY_CARE_PROVIDER_SITE_OTHER): Payer: 59 | Admitting: Obstetrics and Gynecology

## 2017-03-12 VITALS — BP 120/50 | HR 86 | Wt 165.2 lb

## 2017-03-12 DIAGNOSIS — Z331 Pregnant state, incidental: Secondary | ICD-10-CM

## 2017-03-12 DIAGNOSIS — Z3402 Encounter for supervision of normal first pregnancy, second trimester: Secondary | ICD-10-CM

## 2017-03-12 DIAGNOSIS — Z1389 Encounter for screening for other disorder: Secondary | ICD-10-CM

## 2017-03-12 DIAGNOSIS — Z3A22 22 weeks gestation of pregnancy: Secondary | ICD-10-CM

## 2017-03-12 LAB — POCT URINALYSIS DIPSTICK
Blood, UA: NEGATIVE
Glucose, UA: NEGATIVE
Ketones, UA: NEGATIVE
LEUKOCYTES UA: NEGATIVE
Nitrite, UA: NEGATIVE
PROTEIN UA: NEGATIVE

## 2017-03-12 NOTE — Progress Notes (Signed)
Patient ID: Katherine [name], female   DOB: 06/18/1990, 26 y.o.   MRN: [id number] G1P0  [facility]: [facility]  Chief Complaint  Patient presents with  . Routine Prenatal Visit    left arm goes numb; fingertips numb on left hand; acne   ____  Patient complaints: Pt is questioning on if she wants to get an epidural because she has had back pain before and she doesn't want to experience it again. She is wondering if other pain medication is still considered natural. She notes she will want some form of pain medication, but not an epidural. Pt states that her left arm, hand and fingers have been going numb and it is worse at night. She also adds acnes and unwanted facial hair. She denies any other symptoms or complaints at this time. Patient reports good fetal movement, denies any bleeding, rupture of membranes,or regular contractions.  Blood pressure (!) 120/50, pulse 86, weight 165 lb 3.2 oz (74.9 kg), last menstrual period 10/06/2016.   Urine results:notable for negative refer to the ob flow sheet for FH and FHR, ,                          Physical Examination: General appearance - alert, well appearing, and in no distress and oriented to person, place, and time                                      Abdomen - FH - not done                                                        -FHR 159  Questions were answered. Assessment: LROB G1P0 @ 8167w3d   Plan:  Continued routine obstetrical care,   F/u in 4 weeks for LROB  By signing my name below, I, Diona BrownerJennifer Gorman, attest that this documentation has been prepared under the direction and in the presence of Tilda BurrowFerguson, Talmadge Ganas V, MD. Electronically Signed: Diona BrownerJennifer Gorman, Medical Scribe. 03/12/17. 11:34 AM.  I personally performed the services described in this documentation, which was SCRIBED in my presence. The recorded information has been reviewed and considered accurate. It has been edited as necessary during review. Tilda BurrowFERGUSON,Jamiee Milholland  V, MD

## 2017-04-09 ENCOUNTER — Ambulatory Visit (INDEPENDENT_AMBULATORY_CARE_PROVIDER_SITE_OTHER): Payer: 59 | Admitting: Women's Health

## 2017-04-09 ENCOUNTER — Other Ambulatory Visit: Payer: 59

## 2017-04-09 ENCOUNTER — Other Ambulatory Visit: Payer: Self-pay

## 2017-04-09 ENCOUNTER — Encounter: Payer: Self-pay | Admitting: Women's Health

## 2017-04-09 VITALS — BP 110/64 | HR 79 | Wt 170.0 lb

## 2017-04-09 DIAGNOSIS — Z1389 Encounter for screening for other disorder: Secondary | ICD-10-CM

## 2017-04-09 DIAGNOSIS — O2342 Unspecified infection of urinary tract in pregnancy, second trimester: Secondary | ICD-10-CM

## 2017-04-09 DIAGNOSIS — Z3402 Encounter for supervision of normal first pregnancy, second trimester: Secondary | ICD-10-CM

## 2017-04-09 DIAGNOSIS — Z3A26 26 weeks gestation of pregnancy: Secondary | ICD-10-CM

## 2017-04-09 DIAGNOSIS — Z331 Pregnant state, incidental: Secondary | ICD-10-CM

## 2017-04-09 DIAGNOSIS — Z131 Encounter for screening for diabetes mellitus: Secondary | ICD-10-CM

## 2017-04-09 LAB — POCT URINALYSIS DIPSTICK
Blood, UA: NEGATIVE
GLUCOSE UA: NEGATIVE
KETONES UA: NEGATIVE
Leukocytes, UA: NEGATIVE
Nitrite, UA: NEGATIVE
Protein, UA: NEGATIVE

## 2017-04-09 NOTE — Progress Notes (Signed)
   LOW-RISK PREGNANCY VISIT Patient name: Katherine [name] MRN [id number]  Date of birth: 03/18/1991 Chief Complaint:   Routine Prenatal Visit (PN2 today)  History of Present Illness:   Katherine [name] is a 26 y.o. G1P0 female at 2841w3d with an Estimated Date of Delivery: 07/13/17 being seen today for ongoing management of a low-risk pregnancy.  Today she reports acne- tried witch hazel which didn't help. Contractions: Not present. Vag. Bleeding: None.  Movement: Present. denies leaking of fluid. Review of Systems:   Pertinent items are noted in HPI Denies abnormal vaginal discharge w/ itching/odor/irritation, headaches, visual changes, shortness of breath, chest pain, abdominal pain, severe nausea/vomiting, or problems with urination or bowel movements unless otherwise stated above. Pertinent History Reviewed:  Reviewed past medical,surgical, social, obstetrical and family history.  Reviewed problem list, medications and allergies. Physical Assessment:   Vitals:   04/09/17 0922  BP: 110/64  Pulse: 79  Weight: 170 lb (77.1 kg)  Body mass index is 30.11 kg/m.        Physical Examination:   General appearance: Well appearing, and in no distress  Mental status: Alert, oriented to person, place, and time  Skin: Warm & dry  Cardiovascular: Normal heart rate noted  Respiratory: Normal respiratory effort, no distress  Abdomen: Soft, gravid, nontender  Pelvic: Cervical exam deferred         Extremities: Edema: Trace  Fetal Status: Fetal Heart Rate (bpm): 135 Fundal Height: 26 cm Movement: Present    Results for orders placed or performed in visit on 04/09/17 (from the past 24 hour(s))  POCT urinalysis dipstick   Collection Time: 04/09/17  9:23 AM  Result Value Ref Range   Color, UA     Clarity, UA     Glucose, UA neg    Bilirubin, UA     Ketones, UA neg    Spec Grav, UA  1.010 - 1.025   Blood, UA neg    pH, UA  5.0 - 8.0   Protein, UA neg    Urobilinogen, UA  0.2 or 1.0 E.U./dL   Nitrite, UA neg    Leukocytes, UA Negative Negative    Assessment & Plan:  1) Low-risk pregnancy G1P0 at 141w3d with an Estimated Date of Delivery: 07/13/17   2) Acne, gave printed prevention/relief measures, let us know if not helping  3) ASB 02/12/17> will do urine cx poc today   Labs/procedures today: pn2  Plan:  Continue routine obstetrical care   Reviewed: Preterm labor symptoms and general obstetric precautions including but not limited to vaginal bleeding, contractions, leaking of fluid and fetal movement were reviewed in detail with the patient.  Recommended Tdap at HD/PCP per CDC guidelines. All questions were answered  Follow-up: Return in about 4 weeks (around 05/07/2017) for LROB.  Orders Placed This Encounter  Procedures  . Urine Culture  . POCT urinalysis dipstick   Marge DuncansBooker, Javares Kaufhold Randall CNM, [facility]NP[facility] 04/09/2017 9:45 AM

## 2017-04-09 NOTE — Patient Instructions (Addendum)
Katherine [name], I greatly value your feedback.  If you receive a survey following your visit with us today, we appreciate you taking the time to fill it out.  Thanks, Joellyn HaffKim Naba Sneed, CNM, WHNP-BC   [name] the office [phone]) or go to [facility] if:  You begin to have strong, frequent contractions  Your water breaks.  Sometimes it is a big gush of fluid, sometimes it is just a trickle that keeps getting your panties wet or running down your legs  You have vaginal bleeding.  It is normal to have a small amount of spotting if your cervix was checked.   You don't feel your baby moving like normal.  If you don't, get you something to eat and drink and lay down and focus on feeling your baby move.  You should feel at least 10 movements in 2 hours.  If you don't, you should [name] the office or [facility].    Tdap Vaccine  It is recommended that you get the Tdap vaccine during the third trimester of EACH pregnancy to help protect your baby from getting pertussis (whooping cough)  27-36 weeks is the BEST time to do this so that you can pass the protection on to your baby. During pregnancy is better than after pregnancy, but if you are unable to get it during pregnancy it will be offered at the hospital.   You can get this vaccine at the health department or your family doctor  Everyone who will be around your baby should also be up-to-date on their vaccines. Adults (who are not pregnant) only need 1 dose of Tdap during adulthood.   Tips for Helping Acne:  Wash face with warm (not hot) water twice daily with a gentle cleanser such as Cetaphil or Dove Sensitive Skin bar  Do not scrub face or pick at bumps/lesions  Use water-based (not oil-based) lotions, make-up, and hair products    Third Trimester of Pregnancy The third trimester is from week 29 through week 42, months 7 through 9. The third trimester is a time when the fetus is growing rapidly. At the end of the ninth month, the  fetus is about 20 inches in length and weighs 6-10 pounds.  BODY CHANGES Your body goes through many changes during pregnancy. The changes vary from woman to woman.   Your weight will continue to increase. You can expect to gain 25-35 pounds (11-16 kg) by the end of the pregnancy.  You may begin to get stretch marks on your hips, abdomen, and breasts.  You may urinate more often because the fetus is moving lower into your pelvis and pressing on your bladder.  You may develop or continue to have heartburn as a result of your pregnancy.  You may develop constipation because certain hormones are causing the muscles that push waste through your intestines to slow down.  You may develop hemorrhoids or swollen, bulging veins (varicose veins).  You may have pelvic pain because of the weight gain and pregnancy hormones relaxing your joints between the bones in your pelvis. Backaches may result from overexertion of the muscles supporting your posture.  You may have changes in your hair. These can include thickening of your hair, rapid growth, and changes in texture. Some women also have hair loss during or after pregnancy, or hair that feels dry or thin. Your hair will most likely return to normal after your baby is born.  Your breasts will continue to grow and be tender.  A yellow discharge may leak from your breasts called colostrum.  Your belly button may stick out.  You may feel short of breath because of your expanding uterus.  You may notice the fetus "dropping," or moving lower in your abdomen.  You may have a bloody mucus discharge. This usually occurs a few days to a week before labor begins.  Your cervix becomes thin and soft (effaced) near your due date. WHAT TO EXPECT AT YOUR PRENATAL EXAMS  You will have prenatal exams every 2 weeks until week 36. Then, you will have weekly prenatal exams. During a routine prenatal visit:  You will be weighed to make sure you and the fetus are  growing normally.  Your blood pressure is taken.  Your abdomen will be measured to track your baby's growth.  The fetal heartbeat will be listened to.  Any test results from the previous visit will be discussed.  You may have a cervical check near your due date to see if you have effaced. At around 36 weeks, your caregiver will check your cervix. At the same time, your caregiver will also perform a test on the secretions of the vaginal tissue. This test is to determine if a type of bacteria, Group B streptococcus, is present. Your caregiver will explain this further. Your caregiver may ask you:  What your birth plan is.  How you are feeling.  If you are feeling the baby move.  If you have had any abnormal symptoms, such as leaking fluid, bleeding, severe headaches, or abdominal cramping.  If you have any questions. Other tests or screenings that may be performed during your third trimester include:  Blood tests that check for low iron levels (anemia).  Fetal testing to check the health, activity level, and growth of the fetus. Testing is done if you have certain medical conditions or if there are problems during the pregnancy. FALSE LABOR You may feel small, irregular contractions that eventually go away. These are called Braxton Hicks contractions, or false labor. Contractions may last for hours, days, or even weeks before true labor sets in. If contractions come at regular intervals, intensify, or become painful, it is best to be seen by your caregiver.  SIGNS OF LABOR   Menstrual-like cramps.  Contractions that are 5 minutes apart or less.  Contractions that start on the top of the uterus and spread down to the lower abdomen and back.  A sense of increased pelvic pressure or back pain.  A watery or bloody mucus discharge that comes from the vagina. If you have any of these signs before the 37th week of pregnancy, [name] your caregiver right away. You need to go to the  hospital to get checked immediately. HOME CARE INSTRUCTIONS   Avoid all smoking, herbs, alcohol, and unprescribed drugs. These chemicals affect the formation and growth of the baby.  Follow your caregiver's instructions regarding medicine use. There are medicines that are either safe or unsafe to take during pregnancy.  Exercise only as directed by your caregiver. Experiencing uterine cramps is a good sign to stop exercising.  Continue to eat regular, healthy meals.  Wear a good support bra for breast tenderness.  Do not use hot tubs, steam rooms, or saunas.  Wear your seat belt at all times when driving.  Avoid raw meat, uncooked cheese, cat litter boxes, and soil used by cats. These carry germs that can cause birth defects in the baby.  Take your prenatal vitamins.  Try taking a stool  softener (if your caregiver approves) if you develop constipation. Eat more high-fiber foods, such as fresh vegetables or fruit and whole grains. Drink plenty of fluids to keep your urine clear or pale yellow.  Take warm sitz baths to soothe any pain or discomfort caused by hemorrhoids. Use hemorrhoid cream if your caregiver approves.  If you develop varicose veins, wear support hose. Elevate your feet for 15 minutes, 3-4 times a day. Limit salt in your diet.  Avoid heavy lifting, wear low heal shoes, and practice good posture.  Rest a lot with your legs elevated if you have leg cramps or low back pain.  Visit your dentist if you have not gone during your pregnancy. Use a soft toothbrush to brush your teeth and be gentle when you floss.  A sexual relationship may be continued unless your caregiver directs you otherwise.  Do not travel far distances unless it is absolutely necessary and only with the approval of your caregiver.  Take prenatal classes to understand, practice, and ask questions about the labor and delivery.  Make a trial run to the hospital.  Pack your hospital bag.  Prepare  the baby's nursery.  Continue to go to all your prenatal visits as directed by your caregiver. SEEK MEDICAL CARE IF:  You are unsure if you are in labor or if your water has broken.  You have dizziness.  You have mild pelvic cramps, pelvic pressure, or nagging pain in your abdominal area.  You have persistent nausea, vomiting, or diarrhea.  You have a bad smelling vaginal discharge.  You have pain with urination. SEEK IMMEDIATE MEDICAL CARE IF:   You have a fever.  You are leaking fluid from your vagina.  You have spotting or bleeding from your vagina.  You have severe abdominal cramping or pain.  You have rapid weight loss or gain.  You have shortness of breath with chest pain.  You notice sudden or extreme swelling of your face, hands, ankles, feet, or legs.  You have not felt your baby move in over an hour.  You have severe headaches that do not go away with medicine.  You have vision changes. Document Released: 04/18/2001 Document Revised: 04/29/2013 Document Reviewed: 06/25/2012 [facility] Patient Information 2015 [place], [place]. This information is not intended to replace advice given to you by your health care provider. Make sure you discuss any questions you have with your health care provider.

## 2017-04-10 LAB — CBC
HEMOGLOBIN: 11.4 g/dL (ref 11.1–15.9)
Hematocrit: 35.5 % (ref 34.0–46.6)
MCH: 28.9 pg (ref 26.6–33.0)
MCHC: 32.1 g/dL (ref 31.5–35.7)
MCV: 90 fL (ref 79–97)
PLATELETS: 235 10*3/uL (ref 150–379)
RBC: 3.95 x10E6/uL (ref 3.77–5.28)
RDW: 14 % (ref 12.3–15.4)
WBC: 12 10*3/uL — ABNORMAL HIGH (ref 3.4–10.8)

## 2017-04-10 LAB — GLUCOSE TOLERANCE, 2 HOURS W/ 1HR
GLUCOSE, FASTING: 85 mg/dL (ref 65–91)
Glucose, 1 hour: 168 mg/dL (ref 65–179)
Glucose, 2 hour: 137 mg/dL (ref 65–152)

## 2017-04-10 LAB — RPR: RPR: NONREACTIVE

## 2017-04-10 LAB — ANTIBODY SCREEN: Antibody Screen: NEGATIVE

## 2017-04-10 LAB — HIV ANTIBODY (ROUTINE TESTING W REFLEX): HIV Screen 4th Generation wRfx: NONREACTIVE

## 2017-04-11 LAB — URINE CULTURE

## 2017-04-19 ENCOUNTER — Telehealth: Payer: Self-pay | Admitting: Women's Health

## 2017-04-19 NOTE — Telephone Encounter (Signed)
Informed patient that amoxicillin was fine to take. She states dentist prescribed it to her.

## 2017-05-02 ENCOUNTER — Telehealth: Payer: Self-pay | Admitting: *Deleted

## 2017-05-02 ENCOUNTER — Other Ambulatory Visit: Payer: 59

## 2017-05-02 ENCOUNTER — Telehealth (INDEPENDENT_AMBULATORY_CARE_PROVIDER_SITE_OTHER): Payer: 59 | Admitting: *Deleted

## 2017-05-02 DIAGNOSIS — Z1389 Encounter for screening for other disorder: Secondary | ICD-10-CM | POA: Diagnosis not present

## 2017-05-02 DIAGNOSIS — R350 Frequency of micturition: Secondary | ICD-10-CM | POA: Diagnosis not present

## 2017-05-02 DIAGNOSIS — Z331 Pregnant state, incidental: Secondary | ICD-10-CM | POA: Diagnosis not present

## 2017-05-02 LAB — POCT URINALYSIS DIPSTICK
GLUCOSE UA: NEGATIVE
Ketones, UA: NEGATIVE
LEUKOCYTES UA: NEGATIVE
Nitrite, UA: NEGATIVE
PROTEIN UA: NEGATIVE
RBC UA: NEGATIVE

## 2017-05-02 NOTE — Telephone Encounter (Signed)
Patient called with complaints of urinary frequency and bladder pressure. Will come by to leave urine sample.

## 2017-05-02 NOTE — Telephone Encounter (Signed)
LMOVM that urine dip was negative but will send for culture

## 2017-05-04 LAB — URINE CULTURE

## 2017-05-07 ENCOUNTER — Ambulatory Visit (INDEPENDENT_AMBULATORY_CARE_PROVIDER_SITE_OTHER): Payer: 59 | Admitting: Women's Health

## 2017-05-07 ENCOUNTER — Encounter: Payer: Self-pay | Admitting: Women's Health

## 2017-05-07 VITALS — BP 132/52 | HR 87 | Wt 177.0 lb

## 2017-05-07 DIAGNOSIS — O2343 Unspecified infection of urinary tract in pregnancy, third trimester: Secondary | ICD-10-CM

## 2017-05-07 DIAGNOSIS — Z331 Pregnant state, incidental: Secondary | ICD-10-CM

## 2017-05-07 DIAGNOSIS — Z3403 Encounter for supervision of normal first pregnancy, third trimester: Secondary | ICD-10-CM

## 2017-05-07 DIAGNOSIS — Z1389 Encounter for screening for other disorder: Secondary | ICD-10-CM

## 2017-05-07 DIAGNOSIS — Z3A30 30 weeks gestation of pregnancy: Secondary | ICD-10-CM

## 2017-05-07 LAB — POCT URINALYSIS DIPSTICK
Glucose, UA: NEGATIVE
KETONES UA: NEGATIVE
Leukocytes, UA: NEGATIVE
NITRITE UA: NEGATIVE
PROTEIN UA: NEGATIVE
RBC UA: NEGATIVE

## 2017-05-07 MED ORDER — NITROFURANTOIN MONOHYD MACRO 100 MG PO CAPS: 100.0000 mg | ORAL_CAPSULE | Freq: Two times a day (BID) | ORAL | 0 refills | Status: DC

## 2017-05-07 NOTE — Patient Instructions (Signed)
Katherine [name], I greatly value your feedback.  If you receive a survey following your visit with us today, we appreciate you taking the time to fill it out.  Thanks, Katherine [name], CNM, WHNP-BC   [name] the office [phone]) or go to [facility] if:  You begin to have strong, frequent contractions  Your water breaks.  Sometimes it is a big gush of fluid, sometimes it is just a trickle that keeps getting your panties wet or running down your legs  You have vaginal bleeding.  It is normal to have a small amount of spotting if your cervix was checked.   You don't feel your baby moving like normal.  If you don't, get you something to eat and drink and lay down and focus on feeling your baby move.  You should feel at least 10 movements in 2 hours.  If you don't, you should [name] the office or [facility] to [facility].    Tdap Vaccine  It is recommended that you get the Tdap vaccine during the third trimester of EACH pregnancy to help protect your baby from getting pertussis (whooping cough)  27-36 weeks is the BEST time to do this so that you can pass the protection on to your baby. During pregnancy is better than after pregnancy, but if you are unable to get it during pregnancy it will be offered at the hospital.   You can get this vaccine at the health department or your family doctor  Everyone who will be around your baby should also be up-to-date on their vaccines. Adults (who are not pregnant) only need 1 dose of Tdap during adulthood.   Third Trimester of Pregnancy The third trimester is from week 29 through week 42, months 7 through 9. The third trimester is a time when the fetus is growing rapidly. At the end of the ninth month, the fetus is about 20 inches in length and weighs 6-10 pounds.  BODY CHANGES Your body goes through many changes during pregnancy. The changes vary from woman to woman.   Your weight will continue to increase. You can expect to gain 25-35 pounds (11-16 kg) by the  end of the pregnancy.  You may begin to get stretch marks on your hips, abdomen, and breasts.  You may urinate more often because the fetus is moving lower into your pelvis and pressing on your bladder.  You may develop or continue to have heartburn as a result of your pregnancy.  You may develop constipation because certain hormones are causing the muscles that push waste through your intestines to slow down.  You may develop hemorrhoids or swollen, bulging veins (varicose veins).  You may have pelvic pain because of the weight gain and pregnancy hormones relaxing your joints between the bones in your pelvis. Backaches may result from overexertion of the muscles supporting your posture.  You may have changes in your hair. These can include thickening of your hair, rapid growth, and changes in texture. Some women also have hair loss during or after pregnancy, or hair that feels dry or thin. Your hair will most likely return to normal after your baby is born.  Your breasts will continue to grow and be tender. A yellow discharge may leak from your breasts called colostrum.  Your belly button may stick out.  You may feel short of breath because of your expanding uterus.  You may notice the fetus "dropping," or moving lower in your abdomen.  You may have a bloody  mucus discharge. This usually occurs a few days to a week before labor begins.  Your cervix becomes thin and soft (effaced) near your due date. WHAT TO EXPECT AT YOUR PRENATAL EXAMS  You will have prenatal exams every 2 weeks until week 36. Then, you will have weekly prenatal exams. During a routine prenatal visit:  You will be weighed to make sure you and the fetus are growing normally.  Your blood pressure is taken.  Your abdomen will be measured to track your baby's growth.  The fetal heartbeat will be listened to.  Any test results from the previous visit will be discussed.  You may have a cervical check near your due  date to see if you have effaced. At around 36 weeks, your caregiver will check your cervix. At the same time, your caregiver will also perform a test on the secretions of the vaginal tissue. This test is to determine if a type of bacteria, Group B streptococcus, is present. Your caregiver will explain this further. Your caregiver may ask you:  What your birth plan is.  How you are feeling.  If you are feeling the baby move.  If you have had any abnormal symptoms, such as leaking fluid, bleeding, severe headaches, or abdominal cramping.  If you have any questions. Other tests or screenings that may be performed during your third trimester include:  Blood tests that check for low iron levels (anemia).  Fetal testing to check the health, activity level, and growth of the fetus. Testing is done if you have certain medical conditions or if there are problems during the pregnancy. FALSE LABOR You may feel small, irregular contractions that eventually go away. These are called Braxton Hicks contractions, or false labor. Contractions may last for hours, days, or even weeks before true labor sets in. If contractions come at regular intervals, intensify, or become painful, it is best to be seen by your caregiver.  SIGNS OF LABOR   Menstrual-like cramps.  Contractions that are 5 minutes apart or less.  Contractions that start on the top of the uterus and spread down to the lower abdomen and back.  A sense of increased pelvic pressure or back pain.  A watery or bloody mucus discharge that comes from the vagina. If you have any of these signs before the 37th week of pregnancy, [name] your caregiver right away. You need to go to the hospital to get checked immediately. HOME CARE INSTRUCTIONS   Avoid all smoking, herbs, alcohol, and unprescribed drugs. These chemicals affect the formation and growth of the baby.  Follow your caregiver's instructions regarding medicine use. There are medicines that  are either safe or unsafe to take during pregnancy.  Exercise only as directed by your caregiver. Experiencing uterine cramps is a good sign to stop exercising.  Continue to eat regular, healthy meals.  Wear a good support bra for breast tenderness.  Do not use hot tubs, steam rooms, or saunas.  Wear your seat belt at all times when driving.  Avoid raw meat, uncooked cheese, cat litter boxes, and soil used by cats. These carry germs that can cause birth defects in the baby.  Take your prenatal vitamins.  Try taking a stool softener (if your caregiver approves) if you develop constipation. Eat more high-fiber foods, such as fresh vegetables or fruit and whole grains. Drink plenty of fluids to keep your urine clear or pale yellow.  Take warm sitz baths to soothe any pain or discomfort caused by hemorrhoids.  Use hemorrhoid cream if your caregiver approves.  If you develop varicose veins, wear support hose. Elevate your feet for 15 minutes, 3-4 times a day. Limit salt in your diet.  Avoid heavy lifting, wear low heal shoes, and practice good posture.  Rest a lot with your legs elevated if you have leg cramps or low back pain.  Visit your dentist if you have not gone during your pregnancy. Use a soft toothbrush to brush your teeth and be gentle when you floss.  A sexual relationship may be continued unless your caregiver directs you otherwise.  Do not travel far distances unless it is absolutely necessary and only with the approval of your caregiver.  Take prenatal classes to understand, practice, and ask questions about the labor and delivery.  Make a trial run to the hospital.  Pack your hospital bag.  Prepare the baby's nursery.  Continue to go to all your prenatal visits as directed by your caregiver. SEEK MEDICAL CARE IF:  You are unsure if you are in labor or if your water has broken.  You have dizziness.  You have mild pelvic cramps, pelvic pressure, or nagging pain  in your abdominal area.  You have persistent nausea, vomiting, or diarrhea.  You have a bad smelling vaginal discharge.  You have pain with urination. SEEK IMMEDIATE MEDICAL CARE IF:   You have a fever.  You are leaking fluid from your vagina.  You have spotting or bleeding from your vagina.  You have severe abdominal cramping or pain.  You have rapid weight loss or gain.  You have shortness of breath with chest pain.  You notice sudden or extreme swelling of your face, hands, ankles, feet, or legs.  You have not felt your baby move in over an hour.  You have severe headaches that do not go away with medicine.  You have vision changes. Document Released: 04/18/2001 Document Revised: 04/29/2013 Document Reviewed: 06/25/2012 [facility] Patient Information 2015 [place], [place]. This information is not intended to replace advice given to you by your health care provider. Make sure you discuss any questions you have with your health care provider.

## 2017-05-07 NOTE — Progress Notes (Signed)
   LOW-RISK PREGNANCY VISIT Patient name: Katherine [name] MRN [id number]  Date of birth: 12/13/1990 Chief Complaint:   Routine Prenatal Visit  History of Present Illness:   Katherine [name] is a 26 y.o. G1P0 female at 5616w3d with an Estimated Date of Delivery: 07/13/17 being seen today for ongoing management of a low-risk pregnancy.  Today she reports no complaints and thought she had uti 12/26, left urine, dipstick was neg, urine cx sent- hasn't heard anything. Sx are improving, but not completely resolved. Contractions: Not present. Vag. Bleeding: None.  Movement: Present. denies leaking of fluid. Review of Systems:   Pertinent items are noted in HPI Denies abnormal vaginal discharge w/ itching/odor/irritation, headaches, visual changes, shortness of breath, chest pain, abdominal pain, severe nausea/vomiting, or problems with urination or bowel movements unless otherwise stated above. Pertinent History Reviewed:  Reviewed past medical,surgical, social, obstetrical and family history.  Reviewed problem list, medications and allergies. Physical Assessment:   Vitals:   05/07/17 1158  BP: (!) 132/52  Pulse: 87  Weight: 177 lb (80.3 kg)  Body mass index is 31.35 kg/m.        Physical Examination:   General appearance: Well appearing, and in no distress  Mental status: Alert, oriented to person, place, and time  Skin: Warm & dry  Cardiovascular: Normal heart rate noted  Respiratory: Normal respiratory effort, no distress  Abdomen: Soft, gravid, nontender  Pelvic: Cervical exam deferred         Extremities: Edema: Trace  Fetal Status: Fetal Heart Rate (bpm): 140 Fundal Height: 28 cm Movement: Present    Results for orders placed or performed in visit on 05/07/17 (from the past 24 hour(s))  POCT urinalysis dipstick   Collection Time: 05/07/17 11:58 AM  Result Value Ref Range   Color, UA     Clarity, UA     Glucose, UA neg    Bilirubin, UA     Ketones, UA neg    Spec Grav, UA  1.010 - 1.025    Blood, UA neg    pH, UA  5.0 - 8.0   Protein, UA neg    Urobilinogen, UA  0.2 or 1.0 E.U./dL   Nitrite, UA neg    Leukocytes, UA Negative Negative   Appearance     Odor      Urine cx 05/02/17: + citrobacter koseri  Assessment & Plan:  1) Low-risk pregnancy G1P0 at 4516w3d with an Estimated Date of Delivery: 07/13/17   2) UTI, rx macrobid bid x 7d   Labs/procedures today: none  Plan:  Continue routine obstetrical care   Reviewed: Preterm labor symptoms and general obstetric precautions including but not limited to vaginal bleeding, contractions, leaking of fluid and fetal movement were reviewed in detail with the patient.  All questions were answered  Follow-up: Return in about 2 weeks (around 05/21/2017) for LROB.  Orders Placed This Encounter  Procedures  . POCT urinalysis dipstick   Marge DuncansBooker, Naw Lasala Randall CNM, [facility] 05/07/2017 12:26 PM

## 2017-05-08 NOTE — L&D Delivery Note (Signed)
Delivery Note At 5:20 PM a viable female was delivered via Vaginal, Spontaneous (Presentation:VTX;LOA  ).  APGAR:6 ,8 ; weight  9lb 5oz Placenta status:schultz , complete and intact.  Cord: no nuchal with the following complications: none.  Cord pH: NA  Anesthesia:  Epidural  Episiotomy:  None  Lacerations:  None  Suture Repair: NA Est. Blood Loss (mL):  250cc  Mom to postpartum.  Baby to Couplet care / Skin to Skin.  Thressa ShellerHeather Tiffeny Minchew 07/18/2017, 5:34 PM  Mom with persistent temp in labor despite abx. Will send placenta to path, urine culture and flu swab (new onset rhinorrhea as well)

## 2017-05-21 ENCOUNTER — Encounter: Payer: 59 | Admitting: Obstetrics & Gynecology

## 2017-05-29 ENCOUNTER — Ambulatory Visit (INDEPENDENT_AMBULATORY_CARE_PROVIDER_SITE_OTHER): Payer: 59 | Admitting: Obstetrics & Gynecology

## 2017-05-29 ENCOUNTER — Encounter: Payer: Self-pay | Admitting: Obstetrics & Gynecology

## 2017-05-29 ENCOUNTER — Other Ambulatory Visit: Payer: Self-pay

## 2017-05-29 VITALS — BP 106/60 | HR 95 | Wt 177.0 lb

## 2017-05-29 DIAGNOSIS — Z331 Pregnant state, incidental: Secondary | ICD-10-CM

## 2017-05-29 DIAGNOSIS — Z1389 Encounter for screening for other disorder: Secondary | ICD-10-CM

## 2017-05-29 DIAGNOSIS — Z3A33 33 weeks gestation of pregnancy: Secondary | ICD-10-CM

## 2017-05-29 DIAGNOSIS — Z3403 Encounter for supervision of normal first pregnancy, third trimester: Secondary | ICD-10-CM

## 2017-05-29 LAB — POCT URINALYSIS DIPSTICK
Blood, UA: NEGATIVE
Glucose, UA: NEGATIVE
KETONES UA: NEGATIVE
LEUKOCYTES UA: NEGATIVE
NITRITE UA: NEGATIVE

## 2017-05-29 MED ORDER — OMEPRAZOLE 20 MG PO CPDR: 20.0000 mg | DELAYED_RELEASE_CAPSULE | Freq: Every day | ORAL | 6 refills | Status: DC

## 2017-05-29 NOTE — Progress Notes (Signed)
G1P0 3653w4d Estimated Date of Delivery: 07/13/17  Blood pressure 106/60, pulse 95, weight 177 lb (80.3 kg), last menstrual period 10/06/2016.   BP weight and urine results all reviewed and noted.  Please refer to the obstetrical flow sheet for the fundal height and fetal heart rate documentation:  Patient reports good fetal movement, denies any bleeding and no rupture of membranes symptoms or regular contractions. Patient is without complaints. All questions were answered.  Orders Placed This Encounter  Procedures  . POCT urinalysis dipstick    Plan:  Continued routine obstetrical care,  Meds ordered this encounter  Medications  . omeprazole (PRILOSEC) 20 MG capsule    Sig: Take 1 capsule (20 mg total) by mouth daily. 1 tablet a day    Dispense:  30 capsule    Refill:  6     Return in about 2 weeks (around 06/12/2017) for LROB.

## 2017-06-12 ENCOUNTER — Ambulatory Visit (INDEPENDENT_AMBULATORY_CARE_PROVIDER_SITE_OTHER): Payer: Medicaid Other | Admitting: Advanced Practice Midwife

## 2017-06-12 ENCOUNTER — Other Ambulatory Visit: Payer: Self-pay

## 2017-06-12 ENCOUNTER — Encounter: Payer: Self-pay | Admitting: Advanced Practice Midwife

## 2017-06-12 VITALS — BP 118/64 | HR 85 | Wt 177.0 lb

## 2017-06-12 DIAGNOSIS — Z1389 Encounter for screening for other disorder: Secondary | ICD-10-CM

## 2017-06-12 DIAGNOSIS — Z3403 Encounter for supervision of normal first pregnancy, third trimester: Secondary | ICD-10-CM

## 2017-06-12 DIAGNOSIS — Z331 Pregnant state, incidental: Secondary | ICD-10-CM

## 2017-06-12 DIAGNOSIS — Z3A35 35 weeks gestation of pregnancy: Secondary | ICD-10-CM

## 2017-06-12 LAB — POCT URINALYSIS DIPSTICK
GLUCOSE UA: NEGATIVE
KETONES UA: NEGATIVE
Leukocytes, UA: NEGATIVE
Nitrite, UA: NEGATIVE
Protein, UA: NEGATIVE
RBC UA: NEGATIVE

## 2017-06-12 NOTE — Patient Instructions (Signed)

## 2017-06-12 NOTE — Progress Notes (Signed)
G1P0 5223w4d Estimated Date of Delivery: 07/13/17  Blood pressure 118/64, pulse 85, weight 177 lb (80.3 kg), last menstrual period 10/06/2016.   BP weight and urine results all reviewed and noted.  Please refer to the obstetrical flow sheet for the fundal height and fetal heart rate documentation:  Patient reports good fetal movement, denies any bleeding and no rupture of membranes symptoms or regular contractions. Patient is without complaints. All questions were answered.  Orders Placed This Encounter  Procedures  . POCT urinalysis dipstick    Plan:  Continued routine obstetrical care,   Return in about 1 week (around 06/19/2017) for LROB.

## 2017-06-19 ENCOUNTER — Encounter: Payer: Medicaid Other | Admitting: Obstetrics & Gynecology

## 2017-06-20 ENCOUNTER — Ambulatory Visit (INDEPENDENT_AMBULATORY_CARE_PROVIDER_SITE_OTHER): Payer: Medicaid Other | Admitting: Advanced Practice Midwife

## 2017-06-20 VITALS — BP 124/60 | HR 86 | Wt 178.0 lb

## 2017-06-20 DIAGNOSIS — Z3403 Encounter for supervision of normal first pregnancy, third trimester: Secondary | ICD-10-CM

## 2017-06-20 DIAGNOSIS — Z1389 Encounter for screening for other disorder: Secondary | ICD-10-CM

## 2017-06-20 DIAGNOSIS — Z3A36 36 weeks gestation of pregnancy: Secondary | ICD-10-CM

## 2017-06-20 DIAGNOSIS — Z331 Pregnant state, incidental: Secondary | ICD-10-CM

## 2017-06-20 LAB — POCT URINALYSIS DIPSTICK
GLUCOSE UA: NEGATIVE
Ketones, UA: NEGATIVE
LEUKOCYTES UA: NEGATIVE
NITRITE UA: NEGATIVE
PROTEIN UA: NEGATIVE

## 2017-06-20 NOTE — Progress Notes (Signed)
G1P0 1656w5d Estimated Date of Delivery: 07/13/17  Blood pressure 124/60, pulse 86, weight 178 lb (80.7 kg), last menstrual period 10/06/2016.   BP weight and urine results all reviewed and noted.  Please refer to the obstetrical flow sheet for the fundal height and fetal heart rate documentation:  Patient reports good fetal movement, denies any bleeding and no rupture of membranes symptoms or regular contractions. Patient is without complaints. All questions were answered.  Orders Placed This Encounter  Procedures  . Culture, beta strep (group b only)  . GC/Chlamydia Probe Amp([facility])  . POCT Urinalysis Dipstick    Plan:  Continued routine obstetrical care,   Return in about 1 week (around 06/27/2017) for LROB.

## 2017-06-20 NOTE — Patient Instructions (Signed)

## 2017-06-22 LAB — GC/CHLAMYDIA PROBE AMP
Chlamydia trachomatis, NAA: NEGATIVE
Neisseria gonorrhoeae by PCR: NEGATIVE

## 2017-06-24 LAB — CULTURE, BETA STREP (GROUP B ONLY): STREP GP B CULTURE: NEGATIVE

## 2017-06-27 ENCOUNTER — Encounter: Payer: Self-pay | Admitting: Women's Health

## 2017-06-27 ENCOUNTER — Ambulatory Visit (INDEPENDENT_AMBULATORY_CARE_PROVIDER_SITE_OTHER): Payer: Medicaid Other | Admitting: Women's Health

## 2017-06-27 VITALS — BP 110/60 | HR 105 | Wt 178.0 lb

## 2017-06-27 DIAGNOSIS — Z3403 Encounter for supervision of normal first pregnancy, third trimester: Secondary | ICD-10-CM

## 2017-06-27 DIAGNOSIS — Z331 Pregnant state, incidental: Secondary | ICD-10-CM

## 2017-06-27 DIAGNOSIS — Z1389 Encounter for screening for other disorder: Secondary | ICD-10-CM

## 2017-06-27 DIAGNOSIS — Z3A37 37 weeks gestation of pregnancy: Secondary | ICD-10-CM

## 2017-06-27 LAB — POCT URINALYSIS DIPSTICK
Blood, UA: NEGATIVE
Glucose, UA: NEGATIVE
LEUKOCYTES UA: NEGATIVE
NITRITE UA: NEGATIVE
PROTEIN UA: NEGATIVE

## 2017-06-27 NOTE — Progress Notes (Signed)
   LOW-RISK PREGNANCY VISIT Patient name: Katherine [name] MRN [id number]  Date of birth: 01/12/1991 Chief Complaint:   Routine Prenatal Visit  History of Present Illness:   Katherine [name] is a 27 y.o. G1P0 female at 2626w5d with an Estimated Date of Delivery: 07/13/17 being seen today for ongoing management of a low-risk pregnancy.  Today she reports no complaints. Contractions: Not present. Vag. Bleeding: None.  Movement: Present. denies leaking of fluid. Review of Systems:   Pertinent items are noted in HPI Denies abnormal vaginal discharge w/ itching/odor/irritation, headaches, visual changes, shortness of breath, chest pain, abdominal pain, severe nausea/vomiting, or problems with urination or bowel movements unless otherwise stated above. Pertinent History Reviewed:  Reviewed past medical,surgical, social, obstetrical and family history.  Reviewed problem list, medications and allergies. Physical Assessment:   Vitals:   06/27/17 1201  BP: 110/60  Pulse: (!) 105  Weight: 178 lb (80.7 kg)  Body mass index is 31.53 kg/m.        Physical Examination:   General appearance: Well appearing, and in no distress  Mental status: Alert, oriented to person, place, and time  Skin: Warm & dry  Cardiovascular: Normal heart rate noted  Respiratory: Normal respiratory effort, no distress  Abdomen: Soft, gravid, nontender  Pelvic: Cervical exam deferred         Extremities: Edema: Trace  Fetal Status: Fetal Heart Rate (bpm): 160 Fundal Height: 35 cm Movement: Present Presentation: Vertex by Leopold's  Results for orders placed or performed in visit on 06/27/17 (from the past 24 hour(s))  POCT urinalysis dipstick   Collection Time: 06/27/17 12:03 PM  Result Value Ref Range   Color, UA     Clarity, UA     Glucose, UA neg    Bilirubin, UA     Ketones, UA 1+    Spec Grav, UA  1.010 - 1.025   Blood, UA neg    pH, UA  5.0 - 8.0   Protein, UA neg    Urobilinogen, UA  0.2 or 1.0 E.U./dL   Nitrite,  UA neg    Leukocytes, UA Negative Negative   Appearance     Odor      Assessment & Plan:  1) Low-risk pregnancy G1P0 at 2626w5d with an Estimated Date of Delivery: 07/13/17    Meds: No orders of the defined types were placed in this encounter.  Labs/procedures today: none  Plan:  Continue routine obstetrical care   Reviewed: GBS neg, Term labor symptoms and general obstetric precautions including but not limited to vaginal bleeding, contractions, leaking of fluid and fetal movement were reviewed in detail with the patient.  All questions were answered  Follow-up: Return in about 1 week (around 07/04/2017) for LROB.  Orders Placed This Encounter  Procedures  . POCT urinalysis dipstick   Cheral MarkerKimberly R Bryson Gavia CNM, [facility]NP-BC 06/27/2017 12:35 PM

## 2017-06-27 NOTE — Patient Instructions (Signed)
Katherine [name], I greatly value your feedback.  If you receive a survey following your visit with us today, we appreciate you taking the time to fill it out.  Thanks, Joellyn HaffKim Fredis Malkiewicz, CNM, WHNP-BC   [name] the office [phone]) or [facility] if:  You begin to have strong, frequent contractions  Your water breaks.  Sometimes it is a big gush of fluid, sometimes it is just a trickle that keeps getting your panties wet or running down your legs  You have vaginal bleeding.  It is normal to have a small amount of spotting if your cervix was checked.   You don't feel your baby moving like normal.  If you don't, get you something to eat and drink and lay down and focus on feeling your baby move.  You should feel at least 10 movements in 2 hours.  If you don't, you should [name] the office or [facility].     [facility] Contractions of the uterus can occur throughout pregnancy, but they are not always a sign that you are in labor. You may have practice contractions called Braxton Hicks contractions. These false labor contractions are sometimes confused with true labor. What are Deberah PeltonBraxton Hicks contractions? Braxton Hicks contractions are tightening movements that occur in the muscles of the uterus before labor. Unlike true labor contractions, these contractions do not result in opening (dilation) and thinning of the cervix. Toward the end of pregnancy (32-34 weeks), Braxton Hicks contractions can happen more often and may become stronger. These contractions are sometimes difficult to tell apart from true labor because they can be very uncomfortable. You should not feel embarrassed if you go to the hospital with false labor. Sometimes, the only way to tell if you are in true labor is for your health care provider to look for changes in the cervix. The health care provider will do a physical exam and may monitor your contractions. If you are not in true labor, the exam should show  that your cervix is not dilating and your water has not broken. If there are other health problems associated with your pregnancy, it is completely safe for you to be sent home with false labor. You may continue to have Braxton Hicks contractions until you go into true labor. How to tell the difference between true labor and false labor True labor  Contractions last 30-70 seconds.  Contractions become very regular.  Discomfort is usually felt in the top of the uterus, and it spreads to the lower abdomen and low back.  Contractions do not go away with walking.  Contractions usually become more intense and increase in frequency.  The cervix dilates and gets thinner. False labor  Contractions are usually shorter and not as strong as true labor contractions.  Contractions are usually irregular.  Contractions are often felt in the front of the lower abdomen and in the groin.  Contractions may go away when you walk around or change positions while lying down.  Contractions get weaker and are shorter-lasting as time goes on.  The cervix usually does not dilate or become thin. Follow these instructions at home:  Take over-the-counter and prescription medicines only as told by your health care provider.  Keep up with your usual exercises and follow other instructions from your health care provider.  Eat and drink lightly if you think you are going into labor.  If Braxton Hicks contractions are making you uncomfortable: ? Change your position from lying  down or resting to walking, or change from walking to resting. ? Sit and rest in a tub of warm water. ? Drink enough fluid to keep your urine pale yellow. Dehydration may cause these contractions. ? Do slow and deep breathing several times an hour.  Keep all follow-up prenatal visits as told by your health care provider. This is important. Contact a health care provider if:  You have a fever.  You have continuous pain in your  abdomen. Get help right away if:  Your contractions become stronger, more regular, and closer together.  You have fluid leaking or gushing from your vagina.  You pass blood-tinged mucus (bloody show).  You have bleeding from your vagina.  You have low back pain that you never had before.  You feel your baby's head pushing down and causing pelvic pressure.  Your baby is not moving inside you as much as it used to. Summary  Contractions that occur before labor are called Braxton Hicks contractions, false labor, or practice contractions.  Braxton Hicks contractions are usually shorter, weaker, farther apart, and less regular than true labor contractions. True labor contractions usually become progressively stronger and regular and they become more frequent.  Manage discomfort from [facility] contractions by changing position, resting in a warm bath, drinking plenty of water, or practicing deep breathing. This information is not intended to replace advice given to you by your health care provider. Make sure you discuss any questions you have with your health care provider. Document Released: 09/07/2016 Document Revised: 09/07/2016 Document Reviewed: 09/07/2016 Elsevier Interactive Patient Education  2018 [employer].

## 2017-07-04 ENCOUNTER — Encounter: Payer: Self-pay | Admitting: Obstetrics and Gynecology

## 2017-07-04 ENCOUNTER — Ambulatory Visit (INDEPENDENT_AMBULATORY_CARE_PROVIDER_SITE_OTHER): Payer: Medicaid Other | Admitting: Obstetrics and Gynecology

## 2017-07-04 VITALS — BP 120/60 | HR 85 | Wt 180.0 lb

## 2017-07-04 DIAGNOSIS — R102 Pelvic and perineal pain: Secondary | ICD-10-CM

## 2017-07-04 DIAGNOSIS — O9989 Other specified diseases and conditions complicating pregnancy, childbirth and the puerperium: Secondary | ICD-10-CM

## 2017-07-04 DIAGNOSIS — Z3403 Encounter for supervision of normal first pregnancy, third trimester: Secondary | ICD-10-CM

## 2017-07-04 DIAGNOSIS — Z3A38 38 weeks gestation of pregnancy: Secondary | ICD-10-CM

## 2017-07-04 DIAGNOSIS — Z1389 Encounter for screening for other disorder: Secondary | ICD-10-CM

## 2017-07-04 DIAGNOSIS — Z331 Pregnant state, incidental: Secondary | ICD-10-CM

## 2017-07-04 LAB — POCT URINALYSIS DIPSTICK
Glucose, UA: NEGATIVE
KETONES UA: NEGATIVE
Leukocytes, UA: NEGATIVE
Nitrite, UA: NEGATIVE
Protein, UA: NEGATIVE
RBC UA: NEGATIVE

## 2017-07-04 NOTE — Progress Notes (Signed)
LOW-RISK PREGNANCY VISIT Patient name: Katherine [name] MRN [id number]  Date of birth: 12/23/1990 Chief Complaint:   Routine Prenatal Visit (right middle finger gets stiff and locks up)  History of Present Illness:   Katherine [name] is a 27 y.o. G1P0 female at 3246w5d with an Estimated Date of Delivery: 07/13/17 being seen today for ongoing management of a low-risk pregnancy.  Today she reports no complaints. She has noted her middle finger locks up occasionally in the bent position.,  Newly developed this pregnancy no history of trauma since childhood sports injury she has noted some pain around her belly, and notes  It may be gas or constipShe has a question about finishing up paperwork. Contractions: Not present. Vag. Bleeding: None.  Movement: Present. denies leaking of fluid. Review of Systems:   Pertinent items are noted in HPI Denies abnormal vaginal discharge w/ itching/odor/irritation, headaches, visual changes, shortness of breath, chest pain, abdominal pain, severe nausea/vomiting, or problems with urination or bowel movements unless otherwise stated above. Pertinent History Reviewed:  Reviewed past medical,surgical, social, obstetrical and family history.  Reviewed problem list, medications and allergies. Physical Assessment:   Vitals:   07/04/17 1409  BP: 120/60  Pulse: 85  Weight: 180 lb (81.6 kg)  Body mass index is 31.89 kg/m.        Physical Examination:   General appearance: Well appearing, and in no distress  Mental status: Alert, oriented to person, place, and time  Skin: Warm & dry  Cardiovascular: Normal heart rate noted  Respiratory: Normal respiratory effort, no distress  Abdomen: Soft, gravid, nontender  Pelvic: Cervical exam deferred         Extremities: Edema: Trace  Fetal Status: Fetal Heart Rate (bpm): 145 Fundal Height: 38 cm Movement: Present    Results for orders placed or performed in visit on 07/04/17 (from the past 24 hour(s))  POCT urinalysis dipstick   Collection Time: 07/04/17  2:10 PM  Result Value Ref Range   Color, UA     Clarity, UA     Glucose, UA neg    Bilirubin, UA     Ketones, UA neg    Spec Grav, UA  1.010 - 1.025   Blood, UA neg    pH, UA  5.0 - 8.0   Protein, UA neg    Urobilinogen, UA  0.2 or 1.0 E.U./dL   Nitrite, UA neg    Leukocytes, UA Negative Negative   Appearance     Odor      Assessment & Plan:  1) Low-risk pregnancy G1P0 at 4146w5d with an Estimated Date of Delivery: 07/13/17    Meds: No orders of the defined types were placed in this encounter.  Labs/procedures today: none  Plan:  Continue routine obstetrical care   Reviewed: Preterm labor symptoms and general obstetric precautions including but not limited to vaginal bleeding, contractions, leaking of fluid and fetal movement were reviewed in detail with the patient.  All questions were answered  Follow-up: No Follow-up on file.  Orders Placed This Encounter  Procedures  . POCT urinalysis dipstick    By signing my name below, I, Izna Ahmed, attest that this documentation has been prepared under the direction and in the presence of Tilda BurrowFerguson, Raynell Scott V, MD. Electronically Signed: Redge GainerIzna Ahmed, Medical Scribe. 07/04/17. 2:53 PM.  I personally performed the services described in this documentation, which was SCRIBED in my presence. The recorded information has been reviewed and considered accurate. It has been edited as necessary during  review. Jonnie Kind, MD

## 2017-07-10 ENCOUNTER — Encounter: Payer: Self-pay | Admitting: Women's Health

## 2017-07-11 ENCOUNTER — Encounter: Payer: Self-pay | Admitting: Women's Health

## 2017-07-11 ENCOUNTER — Ambulatory Visit (INDEPENDENT_AMBULATORY_CARE_PROVIDER_SITE_OTHER): Payer: Medicaid Other | Admitting: Women's Health

## 2017-07-11 ENCOUNTER — Telehealth (HOSPITAL_COMMUNITY): Payer: Self-pay | Admitting: *Deleted

## 2017-07-11 VITALS — BP 102/58 | HR 88 | Wt 179.0 lb

## 2017-07-11 DIAGNOSIS — Z3A39 39 weeks gestation of pregnancy: Secondary | ICD-10-CM

## 2017-07-11 DIAGNOSIS — O2342 Unspecified infection of urinary tract in pregnancy, second trimester: Secondary | ICD-10-CM

## 2017-07-11 DIAGNOSIS — Z331 Pregnant state, incidental: Secondary | ICD-10-CM

## 2017-07-11 DIAGNOSIS — O480 Post-term pregnancy: Secondary | ICD-10-CM

## 2017-07-11 DIAGNOSIS — Z1389 Encounter for screening for other disorder: Secondary | ICD-10-CM

## 2017-07-11 DIAGNOSIS — Z3403 Encounter for supervision of normal first pregnancy, third trimester: Secondary | ICD-10-CM

## 2017-07-11 LAB — POCT URINALYSIS DIPSTICK
GLUCOSE UA: NEGATIVE
Ketones, UA: NEGATIVE
LEUKOCYTES UA: NEGATIVE
Nitrite, UA: NEGATIVE
Protein, UA: NEGATIVE
RBC UA: NEGATIVE

## 2017-07-11 NOTE — Progress Notes (Signed)
   LOW-RISK PREGNANCY VISIT Patient name: Katherine [name] MRN [id number]  Date of birth: 02/12/1991 Chief Complaint:   Routine Prenatal Visit  History of Present Illness:   Katherine [name] is a 27 y.o. G1P0 female at 2162w5d with an Estimated Date of Delivery: 07/13/17 being seen today for ongoing management of a low-risk pregnancy.  Today she reports no complaints. Contractions: Not present. Vag. Bleeding: None.  Movement: Present. denies leaking of fluid. Review of Systems:   Pertinent items are noted in HPI Denies abnormal vaginal discharge w/ itching/odor/irritation, headaches, visual changes, shortness of breath, chest pain, abdominal pain, severe nausea/vomiting, or problems with urination or bowel movements unless otherwise stated above. Pertinent History Reviewed:  Reviewed past medical,surgical, social, obstetrical and family history.  Reviewed problem list, medications and allergies. Physical Assessment:   Vitals:   07/11/17 1022  BP: (!) 102/58  Pulse: 88  Weight: 179 lb (81.2 kg)  Body mass index is 31.71 kg/m.        Physical Examination:   General appearance: Well appearing, and in no distress  Mental status: Alert, oriented to person, place, and time  Skin: Warm & dry  Cardiovascular: Normal heart rate noted  Respiratory: Normal respiratory effort, no distress  Abdomen: Soft, gravid, nontender  Pelvic: Cervical exam performed  Dilation: 2.5 Effacement (%): 50 Station: -2  Extremities: Edema: Trace  Fetal Status: Fetal Heart Rate (bpm): 150 Fundal Height: 38 cm Movement: Present Presentation: Vertex  Results for orders placed or performed in visit on 07/11/17 (from the past 24 hour(s))  POCT Urinalysis Dipstick   Collection Time: 07/11/17 10:25 AM  Result Value Ref Range   Color, UA     Clarity, UA     Glucose, UA neg    Bilirubin, UA     Ketones, UA neg    Spec Grav, UA  1.010 - 1.025   Blood, UA neg    pH, UA  5.0 - 8.0   Protein, UA neg    Urobilinogen, UA  0.2  or 1.0 E.U./dL   Nitrite, UA neg    Leukocytes, UA Negative Negative   Appearance     Odor      Assessment & Plan:  1) Low-risk pregnancy G1P0 at 1862w5d with an Estimated Date of Delivery: 07/13/17   2) UTI during 2nd trimester, send urine cx poc today   Meds: No orders of the defined types were placed in this encounter.  Labs/procedures today: urine cx, sve  Plan:  Continue routine obstetrical care. IOL for postdates scheduled for 3/15 @ 0730.  IOL form faxed via Epic and orders placed   Reviewed: Term labor symptoms and general obstetric precautions including but not limited to vaginal bleeding, contractions, leaking of fluid and fetal movement were reviewed in detail with the patient.  All questions were answered  Follow-up: Return for Monday for bpp u/s and LROB; order Nexplanon today please.  Orders Placed This Encounter  Procedures  . Urine Culture  . US FETAL BPP WO NON STRESS  . POCT Urinalysis Dipstick   Cheral MarkerKimberly R Lynise Porr CNM, WHNP-BC 07/11/2017 11:00 AM

## 2017-07-11 NOTE — Treatment Plan (Signed)
Induction Assessment Scheduling Form Fax to [facility]:  [phone]  Toney RakesHaley B [name]                                                                                   DOB:  12/18/1990                                                            MRN:  [id number]                                                                     Phone #:   [phone]                         Provider:  [facility]  GP:  G1P0                                                            Estimated Date of Delivery: 07/13/17  Dating Criteria: LMP c/w 7wk u/s    Medical Indications for induction:  postdates Admission Date/Time:  3/15 @ 0730 Gestational age on admission:  1841.0   Filed Weights   07/11/17 1022  Weight: 179 lb (81.2 kg)   HIV:  Non Reactive (12/03 0857) GBS:   neg  2.5/50/-2, vtx   Method of induction(proposed): TBD   Scheduling Provider Signature:  Katherine MarkerKimberly R Luanne [name], CNM                                            Today's Date:  07/11/2017

## 2017-07-11 NOTE — Telephone Encounter (Signed)
Preadmission screen  

## 2017-07-11 NOTE — Patient Instructions (Signed)
Katherine Katherine [name], I greatly value your feedback.  If you receive a survey following your visit with us today, we appreciate you taking the time to fill it out.  Thanks, Katherine Katherine [name], CNM, WHNP-BC  Your induction is scheduled for Friday 3/15 @ 7:30am. [facility] entrance and let them know you are there to be induced. They will send someone from Labor & Delivery to come get you.     Katherine [name] the office [phone]) or [facility] if:  You begin to have strong, frequent contractions  Your water breaks.  Sometimes it is a big gush of fluid, sometimes it is just a trickle that keeps getting your panties wet or running down your legs  You have vaginal bleeding.  It is normal to have a small amount of spotting if your cervix was checked.   You don't feel your baby moving like normal.  If you don't, get you something to eat and drink and lay down and focus on feeling your baby move.  You should feel at least 10 movements in 2 hours.  If you don't, you should Katherine [name] the office or [facility].    [facility] Cont[facility] Contractions of the uterus can occur throughout pregnancy, but they are not always a sign that you are in labor. You may have practice contractions called Braxton Hicks contractions. These false labor contractions are sometimes confused with true labor. What are Katherine PeltonBraxton Hicks contractions? Braxton Hicks contractions are tightening movements that occur in the muscles of the uterus before labor. Unlike true labor contractions, these contractions do not result in opening (dilation) and thinning of the cervix. Toward the end of pregnancy (32-34 weeks), Braxton Hicks contractions can happen more often and may become stronger. These contractions are sometimes difficult to tell apart from true labor because they can be very uncomfortable. You should not feel embarrassed if you go to the hospital with false labor. Sometimes, the only  way to tell if you are in true labor is for your health care provider to look for changes in the cervix. The health care provider will do a physical exam and may monitor your contractions. If you are not in true labor, the exam should show that your cervix is not dilating and your water has not broken. If there are other health problems associated with your pregnancy, it is completely safe for you to be sent home with false labor. You may continue to have Braxton Hicks contractions until you go into true labor. How to tell the difference between true labor and false labor True labor  Contractions last 30-70 seconds.  Contractions become very regular.  Discomfort is usually felt in the top of the uterus, and it spreads to the lower abdomen and low back.  Contractions do not go away with walking.  Contractions usually become more intense and increase in frequency.  The cervix dilates and gets thinner. False labor  Contractions are usually shorter and not as strong as true labor contractions.  Contractions are usually irregular.  Contractions are often felt in the front of the lower abdomen and in the groin.  Contractions may go away when you walk around or change positions while lying down.  Contractions get weaker and are shorter-lasting as time goes on.  The cervix usually does not dilate or become thin. Follow these instructions at home:  Take over-the-counter and prescription medicines only as told by your health care provider.  Keep  up with your usual exercises and follow other instructions from your health care provider.  Eat and drink lightly if you think you are going into labor.  If Braxton Hicks contractions are making you uncomfortable: ? Change your position from lying down or resting to walking, or change from walking to resting. ? Sit and rest in a tub of warm water. ? Drink enough fluid to keep your urine pale yellow. Dehydration may cause these  contractions. ? Do slow and deep breathing several times an hour.  Keep all follow-up prenatal visits as told by your health care provider. This is important. Contact a health care provider if:  You have a fever.  You have continuous pain in your abdomen. Get help right away if:  Your contractions become stronger, more regular, and closer together.  You have fluid leaking or gushing from your vagina.  You pass blood-tinged mucus (bloody show).  You have bleeding from your vagina.  You have low back pain that you never had before.  You feel your baby's head pushing down and causing pelvic pressure.  Your baby is not moving inside you as much as it used to. Summary  Contractions that occur before labor are called Braxton Hicks contractions, false labor, or practice contractions.  Braxton Hicks contractions are usually shorter, weaker, farther apart, and less regular than true labor contractions. True labor contractions usually become progressively stronger and regular and they become more frequent.  Manage discomfort from [facility] contractions by changing position, resting in a warm bath, drinking plenty of water, or practicing deep breathing. This information is not intended to replace advice given to you by your health care provider. Make sure you discuss any questions you have with your health care provider. Document Released: 09/07/2016 Document Revised: 09/07/2016 Document Reviewed: 09/07/2016 Elsevier Interactive Patient Education  2018 [employer]

## 2017-07-13 LAB — URINE CULTURE

## 2017-07-15 ENCOUNTER — Other Ambulatory Visit: Payer: Self-pay | Admitting: Obstetrics and Gynecology

## 2017-07-17 ENCOUNTER — Encounter: Payer: Self-pay | Admitting: Advanced Practice Midwife

## 2017-07-17 ENCOUNTER — Encounter (HOSPITAL_COMMUNITY): Payer: Self-pay | Admitting: *Deleted

## 2017-07-17 ENCOUNTER — Ambulatory Visit (INDEPENDENT_AMBULATORY_CARE_PROVIDER_SITE_OTHER): Payer: Medicaid Other | Admitting: Advanced Practice Midwife

## 2017-07-17 ENCOUNTER — Ambulatory Visit: Payer: Medicaid Other | Admitting: Advanced Practice Midwife

## 2017-07-17 ENCOUNTER — Ambulatory Visit (INDEPENDENT_AMBULATORY_CARE_PROVIDER_SITE_OTHER): Payer: Medicaid Other

## 2017-07-17 ENCOUNTER — Telehealth (HOSPITAL_COMMUNITY): Payer: Self-pay | Admitting: *Deleted

## 2017-07-17 ENCOUNTER — Inpatient Hospital Stay (HOSPITAL_COMMUNITY)
Admission: AD | Admit: 2017-07-17 | Discharge: 2017-07-20 | DRG: 806 | Disposition: A | Discharge status: Home / Self Care | Payer: Medicaid Other | Source: Ambulatory Visit | Attending: Obstetrics and Gynecology | Admitting: Obstetrics and Gynecology

## 2017-07-17 VITALS — BP 130/80 | HR 95 | Wt 181.5 lb

## 2017-07-17 DIAGNOSIS — Z3403 Encounter for supervision of normal first pregnancy, third trimester: Secondary | ICD-10-CM

## 2017-07-17 DIAGNOSIS — O480 Post-term pregnancy: Secondary | ICD-10-CM

## 2017-07-17 DIAGNOSIS — O4292 Full-term premature rupture of membranes, unspecified as to length of time between rupture and onset of labor: Principal | ICD-10-CM | POA: Diagnosis present

## 2017-07-17 DIAGNOSIS — Z3A40 40 weeks gestation of pregnancy: Secondary | ICD-10-CM | POA: Diagnosis not present

## 2017-07-17 DIAGNOSIS — Z331 Pregnant state, incidental: Secondary | ICD-10-CM

## 2017-07-17 DIAGNOSIS — Z1389 Encounter for screening for other disorder: Secondary | ICD-10-CM

## 2017-07-17 DIAGNOSIS — F1290 Cannabis use, unspecified, uncomplicated: Secondary | ICD-10-CM

## 2017-07-17 LAB — POCT URINALYSIS DIPSTICK
GLUCOSE UA: NEGATIVE
KETONES UA: NEGATIVE
Leukocytes, UA: NEGATIVE
Nitrite, UA: NEGATIVE
Protein, UA: NEGATIVE

## 2017-07-17 LAB — CBC
HEMATOCRIT: 36.3 % (ref 36.0–46.0)
HEMOGLOBIN: 12.2 g/dL (ref 12.0–15.0)
MCH: 28.1 pg (ref 26.0–34.0)
MCHC: 33.6 g/dL (ref 30.0–36.0)
MCV: 83.6 fL (ref 78.0–100.0)
Platelets: 206 10*3/uL (ref 150–400)
RBC: 4.34 MIL/uL (ref 3.87–5.11)
RDW: 14.3 % (ref 11.5–15.5)
WBC: 19.9 10*3/uL — AB (ref 4.0–10.5)

## 2017-07-17 LAB — ABO/RH: ABO/RH(D): O POS

## 2017-07-17 LAB — TYPE AND SCREEN
ABO/RH(D): O POS
ANTIBODY SCREEN: NEGATIVE

## 2017-07-17 MED ORDER — OXYTOCIN 40 UNITS IN LACTATED RINGERS INFUSION - SIMPLE MED
2.5000 [IU]/h | INTRAVENOUS | Status: DC
  Filled 2017-07-17: qty 1000

## 2017-07-17 MED ORDER — FENTANYL CITRATE (PF) 100 MCG/2ML IJ SOLN
50.0000 ug | Freq: Once | INTRAMUSCULAR | Status: AC
  Administered 2017-07-17: 50 ug via INTRAMUSCULAR
  Filled 2017-07-17: qty 2

## 2017-07-17 MED ORDER — OXYTOCIN BOLUS FROM INFUSION
500.0000 mL | Freq: Once | INTRAVENOUS | Status: AC
  Administered 2017-07-18: 500 mL via INTRAVENOUS

## 2017-07-17 MED ORDER — GENTAMICIN SULFATE 40 MG/ML IJ SOLN
150.0000 mg | Freq: Three times a day (TID) | INTRAVENOUS | Status: DC
  Administered 2017-07-17 – 2017-07-18 (×3): 150 mg via INTRAVENOUS
  Filled 2017-07-17 (×4): qty 3.75

## 2017-07-17 MED ORDER — LACTATED RINGERS IV SOLN: 500.0000 mL | INTRAVENOUS | Status: DC | PRN

## 2017-07-17 MED ORDER — SOD CITRATE-CITRIC ACID 500-334 MG/5ML PO SOLN
30.0000 mL | ORAL | Status: DC | PRN
  Administered 2017-07-18: 30 mL via ORAL
  Filled 2017-07-17 (×2): qty 15

## 2017-07-17 MED ORDER — FENTANYL CITRATE (PF) 100 MCG/2ML IJ SOLN: 50.0000 ug | Freq: Once | INTRAMUSCULAR | Status: DC

## 2017-07-17 MED ORDER — OXYTOCIN 40 UNITS IN LACTATED RINGERS INFUSION - SIMPLE MED: 1.0000 m[IU]/min | INTRAVENOUS | Status: DC

## 2017-07-17 MED ORDER — TERBUTALINE SULFATE 1 MG/ML IJ SOLN
0.2500 mg | Freq: Once | INTRAMUSCULAR | Status: DC | PRN
  Filled 2017-07-17: qty 1

## 2017-07-17 MED ORDER — FLEET ENEMA 7-19 GM/118ML RE ENEM: 1.0000 | ENEMA | RECTAL | Status: DC | PRN

## 2017-07-17 MED ORDER — SODIUM CHLORIDE 0.9 % IV SOLN
2.0000 g | Freq: Four times a day (QID) | INTRAVENOUS | Status: DC
  Administered 2017-07-17 – 2017-07-18 (×4): 2 g via INTRAVENOUS
  Filled 2017-07-17 (×2): qty 2
  Filled 2017-07-17: qty 2000
  Filled 2017-07-17 (×2): qty 2

## 2017-07-17 MED ORDER — OXYCODONE-ACETAMINOPHEN 5-325 MG PO TABS: 2.0000 | ORAL_TABLET | ORAL | Status: DC | PRN

## 2017-07-17 MED ORDER — LIDOCAINE HCL (PF) 1 % IJ SOLN
30.0000 mL | INTRAMUSCULAR | Status: DC | PRN
  Filled 2017-07-17: qty 30

## 2017-07-17 MED ORDER — FENTANYL CITRATE (PF) 100 MCG/2ML IJ SOLN
50.0000 ug | INTRAMUSCULAR | Status: DC | PRN
  Administered 2017-07-18 (×2): 100 ug via INTRAVENOUS
  Filled 2017-07-17 (×2): qty 2

## 2017-07-17 MED ORDER — OXYCODONE-ACETAMINOPHEN 5-325 MG PO TABS
1.0000 | ORAL_TABLET | ORAL | Status: DC | PRN
  Administered 2017-07-18: 1 via ORAL
  Filled 2017-07-17: qty 1

## 2017-07-17 MED ORDER — HYDROXYZINE HCL 50 MG PO TABS
50.0000 mg | ORAL_TABLET | Freq: Four times a day (QID) | ORAL | Status: DC | PRN
  Filled 2017-07-17: qty 1

## 2017-07-17 MED ORDER — ONDANSETRON HCL 4 MG/2ML IJ SOLN
4.0000 mg | Freq: Four times a day (QID) | INTRAMUSCULAR | Status: DC | PRN
  Administered 2017-07-18: 4 mg via INTRAVENOUS
  Filled 2017-07-17: qty 2

## 2017-07-17 MED ORDER — ACETAMINOPHEN 325 MG PO TABS
650.0000 mg | ORAL_TABLET | ORAL | Status: DC | PRN
  Administered 2017-07-17 – 2017-07-18 (×2): 650 mg via ORAL
  Filled 2017-07-17 (×3): qty 2

## 2017-07-17 MED ORDER — LACTATED RINGERS IV SOLN
INTRAVENOUS | Status: DC
  Administered 2017-07-17 – 2017-07-18 (×4): via INTRAVENOUS

## 2017-07-17 NOTE — Progress Notes (Signed)
Pharmacy Antibiotic Note  Toney RakesHaley B Call is a 27 y.o. female admitted on 07/17/2017 with SOL.  Pharmacy has been consulted for Gentamicin dosing for Triple I  Plan: Gentamicin 150mg  IV q8h Will continue to follow and assess need for further labs  Height: 5\' 3"  (160 cm) Weight: 182 lb 4 oz (82.7 kg) IBW/kg (Calculated) : 52.4  Adjusted BW/Adjusted BW: 61.5kg  Temp (24hrs), Avg:99.8 F (37.7 C), Min:98.4 F (36.9 C), Max:100.5 F (38.1 C)  Recent Labs  Lab 07/17/17 2110  WBC 19.9*    CrCl cannot be calculated (Patient's most recent lab result is older than the maximum 21 days allowed.).    No Known Allergies  Antimicrobials this admission: Ampicillin 2 gram IV q6h  3/12>>    Thank you for allowing pharmacy to be a part of this patient's care.  Claybon Jabsngel, Nataleigh Griffin G 07/17/2017 10:18 PM

## 2017-07-17 NOTE — Telephone Encounter (Signed)
Preadmission screen  

## 2017-07-17 NOTE — Progress Notes (Signed)
Back for cx check after stripping membranes this am.  Cx was almost 4cms, now 4-5/100/-1.  Ctx q 5 minutes.  Pt will head up to [facility]U soon. Not active enough for a direct admit.

## 2017-07-17 NOTE — Progress Notes (Signed)
  G1P0 619w4d Estimated Date of Delivery: 07/13/17  Blood pressure 130/80, pulse 95, weight 181 lb 8 oz (82.3 kg), last menstrual period 10/06/2016.   BP weight and urine results all reviewed and noted.  Please refer to the obstetrical flow sheet for the fundal height and fetal heart rate documentation:  Patient reports good fetal movement, denies any bleeding and no rupture of membranes symptoms or regular contractions. Patient is without complaints. All questions were answered.   Physical Assessment:   Vitals:   07/17/17 1113  BP: 130/80  Pulse: 95  Weight: 181 lb 8 oz (82.3 kg)  Body mass index is 32.15 kg/m.        Physical Examination:   General appearance: Well appearing, and in no distress  Mental status: Alert, oriented to person, place, and time  Skin: Warm & dry  Cardiovascular: Normal heart rate noted  Respiratory: Normal respiratory effort, no distress  Abdomen: Soft, gravid, nontender  Pelvic: Cervical exam performed  Dilation: 4 Effacement (%): 100 Station: -1  Membranes stripped  Extremities: Edema: Trace  Fetal Status:     Movement: Present Presentation: Vertex US 40+4 wks,cephalic,afi 14 cm,fhr 154 bpm,posterior pl gr 3,BPP 8/8   Results for orders placed or performed in visit on 07/17/17 (from the past 24 hour(s))  POCT urinalysis dipstick   Collection Time: 07/17/17 11:14 AM  Result Value Ref Range   Color, UA     Clarity, UA     Glucose, UA neg    Bilirubin, UA     Ketones, UA neg    Spec Grav, UA  1.010 - 1.025   Blood, UA trace    pH, UA  5.0 - 8.0   Protein, UA neg    Urobilinogen, UA  0.2 or 1.0 E.U./dL   Nitrite, UA neg    Leukocytes, UA Negative Negative   Appearance     Odor       Orders Placed This Encounter  Procedures  . POCT urinalysis dipstick    Plan:  Continued routine obstetrical care,   Return in about 6 weeks (around 08/28/2017) for for postpartum and also make nexplanon appt the next day.

## 2017-07-17 NOTE — Patient Instructions (Signed)

## 2017-07-17 NOTE — MAU Note (Signed)
PT SAYS UC STRONG  SINCE 2PM.   PNC  WITH [facility]-  VE   TODAY X2 -    3 CM- THEN STRIPPED MEMBRANES- THEN  AT 4 PM -  5 CM.     DENIES  HSV  OR  MRSA.  GBS- NEG.

## 2017-07-17 NOTE — Progress Notes (Signed)
US 40+4 wks,cephalic,afi 14 cm,fhr 154 bpm,posterior pl gr 3,BPP 8/8

## 2017-07-17 NOTE — H&P (Addendum)
OBSTETRIC ADMISSION HISTORY AND PHYSICAL  Rolly SalterHaley B Call is a 27 y.o. female G1P0 with IUP at 6797w4d by LMP presenting for SOL. She reports +FMs, No LOF, no VB, no blurry vision, headaches or peripheral edema, and RUQ pain.  She plans on breast feeding. She request Nexplanon for birth control. [facility]  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 Bacterial vaginosis     Past Surgical History: Past Surgical History:  Procedure Laterality Date  . TONSILLECTOMY      Obstetrical History: OB History    Gravida Para Term Preterm AB Living   1             SAB TAB Ectopic Multiple Live Births                  Social History: Social History   Socioeconomic History  . Marital status: Single    Spouse name: None  . Number of children: None  . Years of education: None  . Highest education level: None  Social Needs  . Financial resource strain: None  . Food insecurity - worry: None  . Food insecurity - inability: None  . Transportation needs - medical: None  . Transportation needs - non-medical: None  Occupational History  . None  Tobacco Use  . Smoking status: Never Smoker  . Smokeless tobacco: Never Used  Substance and Sexual Activity  . Alcohol use: No    Comment: occasional before pregnancy  . Drug use: No    Comment: last used 11/2016  . Sexual activity: Yes    Birth control/protection: None  Other Topics Concern  . None  Social History Narrative  . None    Family History: Family History  Problem Relation Age of Onset  . Diabetes Mother   . Vision loss Father   . Asthma Brother   . ADD / ADHD Brother   . Drug abuse Brother   . Seizures  Maternal Uncle   . Heart disease Paternal Aunt   . Heart attack Maternal Grandmother   . Alcohol abuse Paternal Grandfather   . Mental retardation Cousin     Allergies: No Known Allergies  Medications Prior to Admission  Medication Sig Dispense Refill Last Dose  . acetaminophen (TYLENOL) 500 MG tablet Take 500 mg by mouth as needed for moderate pain.    07/17/2017 at Unknown time  . Calcium Carbonate Antacid (TUMS PO) Take 1 tablet by mouth daily as needed (heartburn).    Past Week at Unknown time  . omeprazole (PRILOSEC) 20 MG capsule Take 1 capsule (20 mg total) by mouth daily. 1 tablet a day (Patient taking differently: Take 20 mg by mouth as needed. 1 tablet a day) 30 capsule 6 Past Week at Unknown time  . prenatal vitamin w/FE, FA (PRENATAL 1 + 1) 27-1 MG TABS tablet Take 1 tablet by mouth daily at 12 noon. (Patient taking differently: Take 1 tablet by mouth. Every other day) 30 each 12 Past Week at Unknown time     Review of Systems   All systems reviewed and negative except as stated in HPI  Blood pressure 129/82, pulse 91, temperature 98.4 F (36.9 C), temperature  source Oral, resp. rate 20, height 5\' 3"  (1.6 m), weight 82.7 kg (182 lb 4 oz), last menstrual period 10/06/2016. General appearance: alert, oriented, uncomfortable with contractions Heart: regular rate and rhythm Abdomen: soft, non-tender; bowel sounds normal Extremities: no sign of DVT Fetal monitoring: baseline: 160 variability: minimal - accelerations decelerations  Uterine activity irregular every 2-5 minutes Dilation: 5.5 Effacement (%): 100 Exam by:: Roxan Hockey RN    Prenatal labs: ABO, Rh: O/Positive/-- (08/06 1525) Antibody: Negative (12/03 0857) Rubella: 2.81 (08/06 1525) RPR: Non Reactive (12/03 0857)  HBsAg: Negative (08/06 1525)  HIV: Non Reactive (12/03 0857)  GBS:    2 hr Glucola 85/168/137 Genetic screening  NIT/IT neg Anatomy US normal female  Prenatal Transfer Tool  Maternal Diabetes:  No Genetic Screening: Normal Maternal Ultrasounds/Referrals: Normal Fetal Ultrasounds or other Referrals:  None Maternal Substance Abuse:  Marijuana use: last use 11/2016 Significant Maternal Medications:  Meds include: Nexium Significant Maternal Lab Results: Lab values include: Group B Strep negative  Results for orders placed or performed in visit on 07/17/17 (from the past 24 hour(s))  POCT urinalysis dipstick   Collection Time: 07/17/17 11:14 AM  Result Value Ref Range   Color, UA     Clarity, UA     Glucose, UA neg    Bilirubin, UA     Ketones, UA neg    Spec Grav, UA  1.010 - 1.025   Blood, UA trace    pH, UA  5.0 - 8.0   Protein, UA neg    Urobilinogen, UA  0.2 or 1.0 E.U./dL   Nitrite, UA neg    Leukocytes, UA Negative Negative   Appearance     Odor      Patient Active Problem List   Diagnosis Date Noted  . UTI (urinary tract infection) during pregnancy, third trimester 05/07/2017  . Asymptomatic bacteriuria during pregnancy in second trimester 02/12/2017  . Susceptible to varicella (non-immune), currently pregnant 12/13/2016  . Supervision of normal first pregnancy 12/11/2016  . Marijuana use 12/11/2016    Assessment/Plan:  Eliana Lueth Call is a 27 y.o. G1P0 at [redacted]w[redacted]d here for SOL.   #Labor: Expectant management  #Pain: Prefers nitrous oxide, epidural if needed  #FWB:  Category #ID:  GBS negative  #MOF: breast/bottle #MOC: Nexplanon #Circ:  Yes - inpatient  Lillette Boxer, Medical Student  07/17/2017, 9:02 PM   I spoke with and examined patient and agree with resident/PA/SNM's note and plan of care.  Jurnie Garritano Call is a 27 y.o. G1P0 female at [redacted]w[redacted]d by LMP c/w 7wk u/s, presenting in spontaneous labor, membranes swept in office earlier today, was 4cm, now 5 then 5.5 in MAU. Good fm. Denies vb, lof. Uncomfortable w/ uc's, doesn't want epidural, wants to try nitrous. Plans to breast & bottlefeed/circ in hospital, nexplanon for contraception.  Pregnancy  uncomplicated other than UTI x 2  Cat 1FHR UC's q 2-22mins Plan expectant management  Cheral Marker, CNM, WHNP-BC 07/17/2017 10:08 PM

## 2017-07-17 NOTE — MAU Note (Signed)
Urine sent to lab 

## 2017-07-18 ENCOUNTER — Encounter (HOSPITAL_COMMUNITY): Payer: Self-pay | Admitting: *Deleted

## 2017-07-18 ENCOUNTER — Inpatient Hospital Stay (HOSPITAL_COMMUNITY): Payer: Medicaid Other | Admitting: Anesthesiology

## 2017-07-18 DIAGNOSIS — Z3A40 40 weeks gestation of pregnancy: Secondary | ICD-10-CM

## 2017-07-18 LAB — URINALYSIS, ROUTINE W REFLEX MICROSCOPIC
BILIRUBIN URINE: NEGATIVE
Glucose, UA: NEGATIVE mg/dL
Ketones, ur: NEGATIVE mg/dL
NITRITE: NEGATIVE
PROTEIN: 30 mg/dL — AB
SPECIFIC GRAVITY, URINE: 1.009 (ref 1.005–1.030)
pH: 6 (ref 5.0–8.0)

## 2017-07-18 LAB — RPR: RPR: NONREACTIVE

## 2017-07-18 LAB — INFLUENZA PANEL BY PCR (TYPE A & B)
INFLBPCR: NEGATIVE
Influenza A By PCR: NEGATIVE

## 2017-07-18 MED ORDER — IBUPROFEN 600 MG PO TABS
600.0000 mg | ORAL_TABLET | Freq: Four times a day (QID) | ORAL | Status: DC
  Administered 2017-07-18 – 2017-07-20 (×7): 600 mg via ORAL
  Filled 2017-07-18 (×9): qty 1

## 2017-07-18 MED ORDER — WITCH HAZEL-GLYCERIN EX PADS: 1.0000 "application " | MEDICATED_PAD | CUTANEOUS | Status: DC | PRN

## 2017-07-18 MED ORDER — ZOLPIDEM TARTRATE 5 MG PO TABS: 5.0000 mg | ORAL_TABLET | Freq: Every evening | ORAL | Status: DC | PRN

## 2017-07-18 MED ORDER — DIBUCAINE 1 % RE OINT
1.0000 "application " | TOPICAL_OINTMENT | RECTAL | Status: DC | PRN
  Filled 2017-07-18: qty 28

## 2017-07-18 MED ORDER — OXYTOCIN 40 UNITS IN LACTATED RINGERS INFUSION - SIMPLE MED
1.0000 m[IU]/min | INTRAVENOUS | Status: DC
  Administered 2017-07-18: 2 m[IU]/min via INTRAVENOUS

## 2017-07-18 MED ORDER — LIDOCAINE HCL (PF) 1 % IJ SOLN
INTRAMUSCULAR | Status: DC | PRN
  Administered 2017-07-18: 5 mL via EPIDURAL
  Administered 2017-07-18: 5 mL
  Administered 2017-07-18: 13 mL via EPIDURAL

## 2017-07-18 MED ORDER — OXYCODONE-ACETAMINOPHEN 5-325 MG PO TABS: 2.0000 | ORAL_TABLET | ORAL | Status: DC | PRN

## 2017-07-18 MED ORDER — BUPIVACAINE HCL (PF) 0.25 % IJ SOLN
INTRAMUSCULAR | Status: DC | PRN
  Administered 2017-07-18 (×2): 10 mL via EPIDURAL

## 2017-07-18 MED ORDER — LACTATED RINGERS IV SOLN: 500.0000 mL | Freq: Once | INTRAVENOUS | Status: DC

## 2017-07-18 MED ORDER — EPHEDRINE 5 MG/ML INJ
10.0000 mg | INTRAVENOUS | Status: DC | PRN
  Filled 2017-07-18: qty 4
  Filled 2017-07-18: qty 2

## 2017-07-18 MED ORDER — BENZOCAINE-MENTHOL 20-0.5 % EX AERO: 1.0000 "application " | INHALATION_SPRAY | CUTANEOUS | Status: DC | PRN

## 2017-07-18 MED ORDER — ONDANSETRON HCL 4 MG PO TABS: 4.0000 mg | ORAL_TABLET | ORAL | Status: DC | PRN

## 2017-07-18 MED ORDER — PHENYLEPHRINE 40 MCG/ML (10ML) SYRINGE FOR IV PUSH (FOR BLOOD PRESSURE SUPPORT)
80.0000 ug | PREFILLED_SYRINGE | INTRAVENOUS | Status: DC | PRN
  Filled 2017-07-18: qty 10
  Filled 2017-07-18: qty 5

## 2017-07-18 MED ORDER — COCONUT OIL OIL
1.0000 "application " | TOPICAL_OIL | Status: DC | PRN
  Administered 2017-07-20: 1 via TOPICAL
  Filled 2017-07-18: qty 120

## 2017-07-18 MED ORDER — SIMETHICONE 80 MG PO CHEW: 80.0000 mg | CHEWABLE_TABLET | ORAL | Status: DC | PRN

## 2017-07-18 MED ORDER — IBUPROFEN 600 MG PO TABS
600.0000 mg | ORAL_TABLET | Freq: Four times a day (QID) | ORAL | Status: DC | PRN
  Administered 2017-07-18: 600 mg via ORAL
  Filled 2017-07-18: qty 1

## 2017-07-18 MED ORDER — ACETAMINOPHEN 325 MG PO TABS
650.0000 mg | ORAL_TABLET | ORAL | Status: DC | PRN
  Administered 2017-07-19: 650 mg via ORAL

## 2017-07-18 MED ORDER — DIPHENHYDRAMINE HCL 50 MG/ML IJ SOLN: 12.5000 mg | INTRAMUSCULAR | Status: DC | PRN

## 2017-07-18 MED ORDER — EPHEDRINE 5 MG/ML INJ
10.0000 mg | INTRAVENOUS | Status: DC | PRN
Start: 2017-07-18 — End: 2017-07-18
  Administered 2017-07-18: 10 mg via INTRAVENOUS
  Filled 2017-07-18: qty 2

## 2017-07-18 MED ORDER — OXYCODONE-ACETAMINOPHEN 5-325 MG PO TABS: 1.0000 | ORAL_TABLET | ORAL | Status: DC | PRN

## 2017-07-18 MED ORDER — TERBUTALINE SULFATE 1 MG/ML IJ SOLN
0.2500 mg | Freq: Once | INTRAMUSCULAR | Status: DC | PRN
  Filled 2017-07-18: qty 1

## 2017-07-18 MED ORDER — TETANUS-DIPHTH-ACELL PERTUSSIS 5-2.5-18.5 LF-MCG/0.5 IM SUSP: 0.5000 mL | Freq: Once | INTRAMUSCULAR | Status: DC

## 2017-07-18 MED ORDER — PRENATAL MULTIVITAMIN CH
1.0000 | ORAL_TABLET | Freq: Every day | ORAL | Status: DC
  Administered 2017-07-19 – 2017-07-20 (×2): 1 via ORAL
  Filled 2017-07-18 (×2): qty 1

## 2017-07-18 MED ORDER — SENNOSIDES-DOCUSATE SODIUM 8.6-50 MG PO TABS
2.0000 | ORAL_TABLET | ORAL | Status: DC
  Administered 2017-07-19 – 2017-07-20 (×3): 2 via ORAL
  Filled 2017-07-18 (×3): qty 2

## 2017-07-18 MED ORDER — FENTANYL 2.5 MCG/ML BUPIVACAINE 1/10 % EPIDURAL INFUSION (WH - ANES)
14.0000 mL/h | INTRAMUSCULAR | Status: DC | PRN
  Administered 2017-07-18 (×3): 14 mL/h via EPIDURAL
  Filled 2017-07-18 (×3): qty 100

## 2017-07-18 MED ORDER — DIPHENHYDRAMINE HCL 25 MG PO CAPS: 25.0000 mg | ORAL_CAPSULE | Freq: Four times a day (QID) | ORAL | Status: DC | PRN

## 2017-07-18 MED ORDER — ONDANSETRON HCL 4 MG/2ML IJ SOLN: 4.0000 mg | INTRAMUSCULAR | Status: DC | PRN

## 2017-07-18 NOTE — Anesthesia Procedure Notes (Signed)
Epidural Patient location during procedure: OB Start time: 07/18/2017 3:30 AM End time: 07/18/2017 3:45 AM  Staffing Anesthesiologist: Lowella CurbMiller, Warren Ray, MD Performed: anesthesiologist   Preanesthetic Checklist Completed: patient identified, site marked, surgical consent, pre-op evaluation, timeout performed, IV checked, risks and benefits discussed and monitors and equipment checked  Epidural Patient position: sitting Prep: ChloraPrep Patient monitoring: heart rate, cardiac monitor, continuous pulse ox and blood pressure Approach: midline Location: L2-L3 Injection technique: LOR saline  Needle:  Needle type: Tuohy  Needle gauge: 17 G Needle length: 9 cm Needle insertion depth: 4 cm Catheter type: closed end flexible Catheter size: 20 Guage Catheter at skin depth: 8 cm Test dose: negative  Assessment Events: blood not aspirated, injection not painful, no injection resistance, negative IV test and no paresthesia  Additional Notes Reason for block:procedure for pain

## 2017-07-18 NOTE — Anesthesia Procedure Notes (Signed)
Epidural Patient location during procedure: OB Start time: 07/18/2017 1:18 PM End time: 07/18/2017 1:28 PM  Staffing Anesthesiologist: Heather RobertsSinger, Solimar Maiden, MD Performed: anesthesiologist   Preanesthetic Checklist Completed: patient identified, site marked, pre-op evaluation, timeout performed, IV checked, risks and benefits discussed and monitors and equipment checked  Epidural Patient position: sitting Prep: DuraPrep Patient monitoring: heart rate, cardiac monitor, continuous pulse ox and blood pressure Approach: midline Location: L2-L3 Injection technique: LOR saline  Needle:  Needle type: Tuohy  Needle gauge: 17 G Needle length: 9 cm Needle insertion depth: 7 cm Catheter size: 20 Guage Catheter at skin depth: 12 cm Test dose: negative and Other  Assessment Events: blood not aspirated, injection not painful, no injection resistance and negative IV test  Additional Notes Informed consent obtained prior to proceeding including risk of failure, 1% risk of PDPH, risk of minor discomfort and bruising.  Discussed rare but serious complications including epidural abscess, permanent nerve injury, epidural hematoma.  Discussed alternatives to epidural analgesia and patient desires to proceed.  Timeout performed pre-procedure verifying patient name, procedure, and platelet count.  Patient tolerated procedure well.

## 2017-07-18 NOTE — Progress Notes (Signed)
Rolly SalterHaley B Call is a 27 y.o. G1P0 at [redacted]w[redacted]d admitted for PROM  Subjective:  Coping well. Epidural controlling pain at this time.  Objective: BP 111/63   Pulse (!) 117   Temp 99.8 F (37.7 C) (Oral)   Resp 18   Ht 5\' 3"  (1.6 m)   Wt 182 lb 4 oz (82.7 kg)   LMP 10/06/2016 (Exact Date)   SpO2 99%   BMI 32.28 kg/m  No intake/output data recorded. No intake/output data recorded.  FHT:  FHR: 165 bpm, variability: moderate,  accelerations:  Present,  decelerations:  Absent UC:   rare SVE:   Dilation: 7.5 Effacement (%): 100 Station: 0 Exam by:: Valentina Lucks. Woods, RN  Labs: Lab Results  Component Value Date   WBC 19.9 (H) 07/17/2017   HGB 12.2 07/17/2017   HCT 36.3 07/17/2017   MCV 83.6 07/17/2017   PLT 206 07/17/2017    Assessment / Plan: Protracted active phase  Labor: Will start pitocin at this time.  Preeclampsia:  NA Fetal Wellbeing:  Category I Pain Control:  Epidural I/D:  On Amp and Gent for suspected Triple I Anticipated MOD:  NSVD  Thressa ShellerHeather Rio Kidane 07/18/2017, 10:29 AM

## 2017-07-18 NOTE — Anesthesia Preprocedure Evaluation (Signed)

## 2017-07-18 NOTE — Progress Notes (Signed)
Pt asking about HSV 1 testing so that she can kiss her baby. Provider notified. RN was instructed to educate the patient on refraining from kissing the baby when there are active cold sores. Patient was educated.   Kimie Pidcock L Trenia Tennyson, RN

## 2017-07-18 NOTE — Anesthesia Pain Management Evaluation Note (Signed)
  CRNA Pain Management Visit Note  Patient: Katherine RakesHaley B Call, 27 y.o., female  "Hello I am a [facility] of the [facility]. We have an anesthesia team available at all times to provide care throughout the hospital, including epidural management and anesthesia for C-section. I don't know your plan for the delivery whether it a natural birth, water birth, IV sedation, nitrous supplementation, doula or epidural, but we want to meet your pain goals."   1.Was your pain managed to your expectations on prior hospitalizations?   Yes   2.What is your expectation for pain management during this hospitalization?     epidural  3.How can we help you reach that goal? epidural  Record the patient's initial score and the patient's pain goal.   Pain: 5/10  Pain Goal: 0/10 [facility] wants you to be able to say your pain was always managed very well.  Salome ArntSterling, Phaedra Colgate Marie 07/18/2017

## 2017-07-18 NOTE — Progress Notes (Addendum)
Patient ID: Katherine RakesHaley B [name], female   DOB: 11/26/1990, 27 y.o.   MRN: [id number] Katherine RakesHaley B [name] is a 27 y.o. G1P0 at 8659w5d admitted for active labor  Subjective: Comfortable w/ epidural, no complaints. Had gush of fluid at 2350  Objective: BP 105/74   Pulse 98   Temp 99.8 F (37.7 C) (Axillary)   Resp 17   Ht 5\' 3"  (1.6 m)   Wt 82.7 kg (182 lb 4 oz)   LMP 10/06/2016 (Exact Date)   SpO2 99%   BMI 32.28 kg/m  No intake/output data recorded.  FHT:  FHR: 150 bpm, variability: moderate,  accelerations:  Present,  decelerations:  Absent UC:   q 2-55mins  SVE:   4.5/100/-2 AROM forebag/BBOW, sm-mod amt clear fluid  Labs: Lab Results  Component Value Date   WBC 19.9 (H) 07/17/2017   HGB 12.2 07/17/2017   HCT 36.3 07/17/2017   MCV 83.6 07/17/2017   PLT 206 07/17/2017    Assessment / Plan: Spontaneous labor, progressing normally, now arom'd  Labor: Progressing normally Fetal Wellbeing:  Category I Pain Control:  Epidural Pre-eclampsia: n/a I/D:  Amp/gent for Triple I Anticipated MOD:  NSVD  Cheral MarkerKimberly R Booker CNM, WHNP-BC 07/18/2017, 4:23 AM

## 2017-07-18 NOTE — Progress Notes (Signed)
Patient ID: Katherine [name], female   DOB: 12/30/1990, 27 y.o.   MRN: [id number] Katherine [name] is a 27 y.o. G1P0 at 736w5d admitted for active labor  Subjective: Comfortable w/ epidural, eating jello  Objective: BP 108/62   Pulse 99   Temp 98.6 F (37 C) (Oral)   Resp 18   Ht 5\' 3"  (1.6 m)   Wt 82.7 kg (182 lb 4 oz)   LMP 10/06/2016 (Exact Date)   SpO2 99%   BMI 32.28 kg/m  No intake/output data recorded.  FHT:  FHR: 150 bpm, variability: minimal ,  accelerations:  Present,  decelerations:  Absent UC:   q 4-715mins  SVE:   7/100/-1  Labs: Lab Results  Component Value Date   WBC 19.9 (H) 07/17/2017   HGB 12.2 07/17/2017   HCT 36.3 07/17/2017   MCV 83.6 07/17/2017   PLT 206 07/17/2017    Assessment / Plan: Spontaneous labor, progressing normally  Labor: Progressing normally Fetal Wellbeing:  Category II Pain Control:  Epidural Pre-eclampsia: n/a I/D:  Amp/gent for TripleI Anticipated MOD:  NSVD  Cheral MarkerKimberly R Esteban Kobashigawa CNM, WHNP-BC 07/18/2017, 8:05 AM

## 2017-07-18 NOTE — Progress Notes (Signed)
Labor Progress Note Katherine RakesHaley B [name] is a 27 y.o. G1P0 at 7651w5d presented for SOL.  S: pt comfortable with contractions  O:  BP 128/80 (BP Location: Left Arm)   Pulse (!) 109   Temp 99.8 F (37.7 C) (Axillary)   Resp 17   Ht 5\' 3"  (1.6 m)   Wt 82.7 kg (182 lb 4 oz)   LMP 10/06/2016 (Exact Date)   SpO2 100%   BMI 32.28 kg/m   ZOX:[id number]:baseline rate 160, moderate variability, + acels, - decels Toco: ctx every 2-3 min   CVE: Dilation: 5.5 Effacement (%): 100 Exam by:: Roxan Hockeyraci Lytle RN    A&P: 27 y.o. G1P0 6451w5d who presents with SOL. SROM @23 :49 #Labor: Progressing well. Continue with expectant managment #Pain: using nitrous. Wants to avoid epidural if possible #FWB: category I #GBS negative  Lillette BoxerStephanie R Destyni Hoppel, Medical Student 12:54 AM

## 2017-07-19 LAB — CBC
HCT: 29.6 % — ABNORMAL LOW (ref 36.0–46.0)
HEMOGLOBIN: 9.9 g/dL — AB (ref 12.0–15.0)
MCH: 28.1 pg (ref 26.0–34.0)
MCHC: 33.4 g/dL (ref 30.0–36.0)
MCV: 84.1 fL (ref 78.0–100.0)
Platelets: 172 10*3/uL (ref 150–400)
RBC: 3.52 MIL/uL — AB (ref 3.87–5.11)
RDW: 14.8 % (ref 11.5–15.5)
WBC: 26.2 10*3/uL — AB (ref 4.0–10.5)

## 2017-07-19 NOTE — Lactation Note (Addendum)
This note was copied from a baby's chart. Lactation Consultation Note  Patient Name: Katherine [name] Today's Date: 07/19/2017   RN made aware of respiration count. Infant is not retracting, there is no nasal flaring & no grunting. Infant was at rest when this was observed.   Mom instructed to put infant skin-to-skin.  Lurline HareRichey, Katherine [name] 07/19/2017, 7:03 PM

## 2017-07-19 NOTE — Progress Notes (Signed)
Post Partum Day #1 Subjective: no complaints, up ad lib and tolerating PO; breastfeeding going well; desires Nexplanon for pp contraception; afebrile since 1800  Objective: Blood pressure 115/67, pulse 66, temperature 98 F (36.7 C), temperature source Oral, resp. rate 18, height 5\' 3"  (1.6 m), weight 82.7 kg (182 lb 4 oz), last menstrual period 10/06/2016, SpO2 97 %, unknown if currently breastfeeding.  Physical Exam:  General: alert, cooperative and no distress Lochia: appropriate Uterine Fundus: firm DVT Evaluation: No evidence of DVT seen on physical exam.  Recent Labs    07/17/17 2110 07/19/17 0449  HGB 12.2 9.9*  HCT 36.3 29.6*   WBC: 20>26 this AM  Assessment/Plan: Plan for discharge tomorrow  Family planning to have circ done here   LOS: 2 days   Cam HaiSHAW, KIMBERLY CNM 07/19/2017, 6:28 AM

## 2017-07-19 NOTE — Lactation Note (Signed)
This note was copied from a baby's chart. Lactation Consultation Note  Patient Name: Katherine [name] Today's Date: 07/19/2017   Mom needing size 21 flange on R side, size 24 flange on L. Mom provided with NICU booklet & colostrum stickers. Mom had previously been taught hand expression by this LC.   Katherine [name], [name] 07/19/2017, 7:45 PM

## 2017-07-19 NOTE — Anesthesia Postprocedure Evaluation (Signed)
Anesthesia Post Note  Patient: Katherine [name]  Procedure(s) Performed: AN AD HOC LABOR EPIDURAL     Patient location during evaluation: Mother Baby Anesthesia Type: Epidural Level of consciousness: awake Pain management: pain level controlled Vital Signs Assessment: post-procedure vital signs reviewed and stable Respiratory status: spontaneous breathing Cardiovascular status: stable Postop Assessment: epidural receding and patient able to bend at knees Anesthetic complications: no    Last Vitals:  Vitals:   07/18/17 2106 07/19/17 0149  BP: 105/61 115/67  Pulse: 87 66  Resp: 18 18  Temp: 36.8 C 36.7 C  SpO2:      Last Pain:  Vitals:   07/19/17 0613  TempSrc:   PainSc: 0-No pain   Pain Goal: Patients Stated Pain Goal: 7 (07/17/17 2228)               Edison PaceWILKERSON,Myrtle Barnhard

## 2017-07-19 NOTE — Lactation Note (Signed)
This note was copied from a baby's chart. Lactation Consultation Note Baby 10 hrs old. FOB doing STS w/baby d/t low temp. Baby sleeping soundly. Mom has been doing STS as well. Mom states BF going well. Mom has good everted nipples, pendulous breast. Hand expressed easy flow of colostrum.  Discussed newborn feeding habits, STS, I&O, cluster feeding, positions, breast massage, supply and demand, milk storage, pumping, and burping baby. Mom encouraged to feed baby 8-12 times/24 hours and with feeding cues.  Encouraged mom to call for assistance or questions. Answered mom's questions. Mom took some noted and teach back discussion.  [facility]/LC brochure given w/resources, support groups and LC services.  Patient Name: Katherine [name] Call Today's Date: 07/19/2017 Reason for consult: Initial assessment   Maternal Data Has patient been taught Hand Expression?: Yes Does the patient have breastfeeding experience prior to this delivery?: No  Feeding    LATCH Score       Type of Nipple: Everted at rest and after stimulation  Comfort (Breast/Nipple): Soft / non-tender        Interventions Interventions: Breast feeding basics reviewed;Support pillows;Position options;Breast massage;Hand express;Breast compression  Lactation Tools Discussed/Used WIC Program: Yes   Consult Status Consult Status: Follow-up Date: 07/19/17 Follow-up type: In-patient    Charyl DancerCARVER, Sherma Vanmetre G 07/19/2017, 4:18 AM

## 2017-07-20 ENCOUNTER — Inpatient Hospital Stay (HOSPITAL_COMMUNITY): Admission: RE | Admit: 2017-07-20 | Payer: BLUE CROSS/BLUE SHIELD | Source: Ambulatory Visit

## 2017-07-20 LAB — URINE CULTURE: Culture: NO GROWTH

## 2017-07-20 MED ORDER — IBUPROFEN 600 MG PO TABS: 600.0000 mg | ORAL_TABLET | Freq: Four times a day (QID) | ORAL | 0 refills | Status: DC

## 2017-07-20 NOTE — Progress Notes (Signed)
CSW received consult for hx of Anxiety.  CSW met with MOB to offer support and complete assessment.    CSW met with MOB in room 117.  When CSW arrived MOB was eating breakfast, infant was in NICU, and FOB was watching TV.  CSW explained CSW's role and MOB gave CSW permission to complete the assessment while FOB was present.   CSW shared concerns regarding MOB displaying feelings of anxiousness.  MOB acknowledged having questions about being a new mother and feeling overwhelmed but minimized her symptoms. CSW validated and normalized MOB's feelings especially since infant's recent NICU admission.  MOB denied having any feelings of being scared and had little to no questions about infant's NICU admission.   CSW provided education regarding the baby blues period vs. perinatal mood disorders, discussed treatment and gave resources for mental health follow up if concerns arise.  CSW recommends self-evaluation during the postpartum time period using the New Mom Checklist from Postpartum Progress and encouraged MOB to contact a medical professional if symptoms are noted at any time. CSW assessed for safety and MOB denied SI and HI.  MOB reported having a good support team and expressed feeling prepared to parent.  CSW offered MOB resources for outpatient counseling and MOB declined.   CSW provided review of Sudden Infant Death Syndrome (SIDS) precautions.    CSW will continue to offer resources and supports to family while infant remains in NICU.   Laurey Arrow, MSW, LCSW Clinical Social Work [phone]

## 2017-07-20 NOTE — Discharge Summary (Signed)
OB Discharge Summary    Patient Name: Katherine [name] Call DOB: 10-23-1990 MRN: [id number] Date of admission: 07/17/2017  Delivering MD: Thressa Sheller D   Date of discharge: 07/20/2017  Admitting diagnosis: SOL Intrauterine pregnancy: [redacted]w[redacted]d    Secondary diagnosis:  Active Problems:   Patient Active Problem List   Diagnosis Date Noted  . Post-dates pregnancy 07/17/2017  . UTI (urinary tract infection) during pregnancy, third trimester 05/07/2017  . Asymptomatic bacteriuria during pregnancy in second trimester 02/12/2017  . Susceptible to varicella (non-immune), currently pregnant 12/13/2016  . Supervision of normal first pregnancy 12/11/2016  . Marijuana use 12/11/2016   Additional problems:  Marijuana use in 1st trimester - last use 11/2016     Discharge diagnosis: Term Pregnancy Delivered                                                                                                Post partum procedures: none  Complications: Intrauterine Inflammation or infection (Chorioamniotis)  Hospital course:  Onset of Labor With Vaginal Delivery     27 y.o. G1P1001 at [redacted]w[redacted]d was admitted in Active Labor on 07/17/2017. Patient had an uncomplicated labor course as follows:  Membrane Rupture Time/Date: 11:49 PM ,07/17/2017   Intrapartum Procedures: Episiotomy: None [1]                                         Lacerations:    None Patient had a delivery of a Viable infant. 07/18/2017  Information for the patient's newborn:  Call, Boy Maigen [[id number]]  Delivery Method: Vaginal, Spontaneous(Filed from Delivery Summary)   Pateint had an uncomplicated postpartum course, remained afebrile with normal BP.  She is ambulating, tolerating a regular diet, passing flatus, and urinating well. Patient is discharged home in stable condition on 07/20/17.  Physical exam  Vitals:   07/19/17 0149 07/19/17 1728  BP: 115/67 (!) 110/58  Pulse: 66 76  Resp: 18 18  Temp: 98 F (36.7 C) 97.7 F (36.5 C)  SpO2:      General: alert, cooperative and no distress Lochia: appropriate Uterine Fundus: firm Incision: N/A DVT Evaluation: No evidence of DVT seen on physical exam.  Labs: No results found for this or any previous visit (from the past 24 hour(s)).  Discharge instruction: per After Visit Summary and "Baby and Me Booklet".  After visit meds:  No Known Allergies  Allergies as of 07/20/2017   No Known Allergies     Medication List    TAKE these medications   acetaminophen 500 MG tablet Commonly known as:  TYLENOL Take 500 mg by mouth as needed for moderate pain.   ibuprofen 600 MG tablet Commonly known as:  ADVIL,MOTRIN Take 1 tablet (600 mg total) by mouth every 6 (six) hours.   omeprazole 20 MG capsule Commonly known as:  PRILOSEC Take 1 capsule (20 mg total) by mouth daily. 1 tablet a day What changed:    when to take this  reasons to take this  additional instructions   prenatal  vitamin w/FE, FA 27-1 MG Tabs tablet Take 1 tablet by mouth daily at 12 noon. What changed:    when to take this  additional instructions   TUMS PO Take 1 tablet by mouth daily as needed (heartburn).      Diet: routine diet  Activity: Advance as tolerated. Pelvic rest for 6 weeks.   Outpatient follow up: 4 weeks Future Appointments:  Future Appointments  Date Time Provider Department Center  08/28/2017  2:00 PM Jacklyn ShellCresenzo-Dishmon, Frances, CNM [facility]  08/29/2017  2:00 PM Cresenzo-Dishmon, Scarlette CalicoFrances, CNM [facility]    Follow up Appt: No Follow-up on file.  Postpartum contraception: Nexplanon  Newborn Data: APGAR (1 MIN): 6   APGAR (5 MINS): 8    Baby Feeding: Breast Disposition:NICU  Ellwood Denselison Rumball, DO  07/20/2017

## 2017-07-20 NOTE — Lactation Note (Signed)
This note was copied from a baby's chart. Lactation Consultation Note  Patient Name: Katherine [name] Today's Date: 07/20/2017 Reason for consult: Follow-up assessment;1st time breastfeeding;NICU baby  Mother has infant in NICU; baby is 40 weeks and is her first baby.  She has been pumping and LC fit her for the correct flange size last evening.  Advised mother to [name] LC today to recheck flange size when she returns from NICU.  Mother states that she is only pumping drops of colostrum at this time; reassured mother that this is normal. Encouraged breast massage before and during pumping. She has no pain when pumping and is going up to NICU now in hopes of putting baby to breast and doing skin to skin.  She will [name] LC upon returning and provide an update on infant.  Maternal Data Has patient been taught Hand Expression?: Yes Does the patient have breastfeeding experience prior to this delivery?: No  Feeding    LATCH Score                   Interventions    Lactation Tools Discussed/Used     Consult Status Consult Status: Follow-up Date: 07/21/17    Irene PapBeth R Leviathan Macera 07/20/2017, 9:09 AM

## 2017-07-20 NOTE — Discharge Instructions (Signed)
Vaginal Delivery, Care After °Refer to this sheet in the next few weeks. These instructions provide you with information about caring for yourself after vaginal delivery. Your health care provider may also give you more specific instructions. Your treatment has been planned according to current medical practices, but problems sometimes occur. Call your health care provider if you have any problems or questions. °What can I expect after the procedure? °After vaginal delivery, it is common to have: °· Some bleeding from your vagina. °· Soreness in your abdomen, your vagina, and the area of skin between your vaginal opening and your anus (perineum). °· Pelvic cramps. °· Fatigue. ° °Follow these instructions at home: °Medicines °· Take over-the-counter and prescription medicines only as told by your health care provider. °· If you were prescribed an antibiotic medicine, take it as told by your health care provider. Do not stop taking the antibiotic until it is finished. °Driving ° °· Do not drive or operate heavy machinery while taking prescription pain medicine. °· Do not drive for 24 hours if you received a sedative. °Lifestyle °· Do not drink alcohol. This is especially important if you are breastfeeding or taking medicine to relieve pain. °· Do not use tobacco products, including cigarettes, chewing tobacco, or e-cigarettes. If you need help quitting, ask your health care provider. °Eating and drinking °· Drink at least 8 eight-ounce glasses of water every day unless you are told not to by your health care provider. If you choose to breastfeed your baby, you may need to drink more water than this. °· Eat high-fiber foods every day. These foods may help prevent or relieve constipation. High-fiber foods include: °? Whole grain cereals and breads. °? Brown rice. °? Beans. °? Fresh fruits and vegetables. °Activity °· Return to your normal activities as told by your health care provider. Ask your health care provider  what activities are safe for you. °· Rest as much as possible. Try to rest or take a nap when your baby is sleeping. °· Do not lift anything that is heavier than your baby or 10 lb (4.5 kg) until your health care provider says that it is safe. °· Talk with your health care provider about when you can engage in sexual activity. This may depend on your: °? Risk of infection. °? Rate of healing. °? Comfort and desire to engage in sexual activity. °Vaginal Care °· If you have an episiotomy or a vaginal tear, check the area every day for signs of infection. Check for: °? More redness, swelling, or pain. °? More fluid or blood. °? Warmth. °? Pus or a bad smell. °· Do not use tampons or douches until your health care provider says this is safe. °· Watch for any blood clots that may pass from your vagina. These may look like clumps of dark red, brown, or black discharge. °General instructions °· Keep your perineum clean and dry as told by your health care provider. °· Wear loose, comfortable clothing. °· Wipe from front to back when you use the toilet. °· Ask your health care provider if you can shower or take a bath. If you had an episiotomy or a perineal tear during labor and delivery, your health care provider may tell you not to take baths for a certain length of time. °· Wear a bra that supports your breasts and fits you well. °· If possible, have someone help you with household activities and help care for your baby for at least a few days after   you leave the hospital. °· Keep all follow-up visits for you and your baby as told by your health care provider. This is important. °Contact a health care provider if: °· You have: °? Vaginal discharge that has a bad smell. °? Difficulty urinating. °? Pain when urinating. °? A sudden increase or decrease in the frequency of your bowel movements. °? More redness, swelling, or pain around your episiotomy or vaginal tear. °? More fluid or blood coming from your episiotomy or  vaginal tear. °? Pus or a bad smell coming from your episiotomy or vaginal tear. °? A fever. °? A rash. °? Little or no interest in activities you used to enjoy. °? Questions about caring for yourself or your baby. °· Your episiotomy or vaginal tear feels warm to the touch. °· Your episiotomy or vaginal tear is separating or does not appear to be healing. °· Your breasts are painful, hard, or turn red. °· You feel unusually sad or worried. °· You feel nauseous or you vomit. °· You pass large blood clots from your vagina. If you pass a blood clot from your vagina, save it to show to your health care provider. Do not flush blood clots down the toilet without having your health care provider look at them. °· You urinate more than usual. °· You are dizzy or light-headed. °· You have not breastfed at all and you have not had a menstrual period for 12 weeks after delivery. °· You have stopped breastfeeding and you have not had a menstrual period for 12 weeks after you stopped breastfeeding. °Get help right away if: °· You have: °? Pain that does not go away or does not get better with medicine. °? Chest pain. °? Difficulty breathing. °? Blurred vision or spots in your vision. °? Thoughts about hurting yourself or your baby. °· You develop pain in your abdomen or in one of your legs. °· You develop a severe headache. °· You faint. °· You bleed from your vagina so much that you fill two sanitary pads in one hour. °This information is not intended to replace advice given to you by your health care provider. Make sure you discuss any questions you have with your health care provider. °Document Released: 04/21/2000 Document Revised: 10/06/2015 Document Reviewed: 05/09/2015 °Elsevier Interactive Patient Education © 2018 [employer] Inc[employer] ° °

## 2017-07-20 NOTE — Lactation Note (Signed)
This note was copied from a baby's chart. Lactation Consultation Note  Patient Name: Katherine [name] Today's Date: 07/20/2017 Reason for consult: Follow-up assessment;1st time breastfeeding;NICU baby;Term  2nd LC visit today LC assessed flanges while mom pumping.  LC had mom apply coconut oil on nipples areolas , and the  Mom started pumping with #24 on the left and #21 on the right.  The #24 F on the left a good fit and on the right #21 flange was not accommodating the areola  Switched mom to #24 on the right and it was stimulating the areola more.  Per mom comfortable for both.  Mom is for D/C today, sore nipple and engorgement prevention and tx reviewed.  Supply and demand and the importance of consistent pump[ing at least 8 x's a day or prn.  Per mom active with [facility] [place] - LC [facility]  And mom declined to today and desires to use her own DEBP - [employer].  Requested assistance to set up, LC assisted and noted the brand had changed and seemed to  Have better suction and strength compared to earlier models.  LC reminded mom to plan on pumping in NICU when visiting baby in NICU.  Mother informed of post-discharge support and given phone number to the lactation department, including services for phone [name] assistance; out-patient appointments; and breastfeeding support group. List of other breastfeeding resources in the community given in the handout. Encouraged mother to [name] for problems or concerns related to breastfeeding.   Maternal Data Has patient been taught Hand Expression?: Yes  Feeding Feeding Type: Formula(with 5ml breastmilk) Nipple Type: Slow - flow Length of feed: 15 min  LATCH Score                   Interventions Interventions: Breast feeding basics reviewed  Lactation Tools Discussed/Used Tools: Pump;Flanges Flange Size: 24(checked and the #24 F was the best fit for breast ) Breast pump type: Double-Electric  Breast Pump WIC Program: Yes Pump Review: Setup, frequency, and cleaning;Milk Storage Initiated by:: MAI / reviewed  Date initiated:: 07/20/17   Consult Status Consult Status: PRN Date: (baby in NICU ) Follow-up type: Other (comment)(baby in NICU )    Katherine SprangMargaret Ann Devaughn [name] 07/20/2017, 1:17 PM

## 2017-07-21 ENCOUNTER — Encounter: Payer: Self-pay | Admitting: Women's Health

## 2017-08-01 ENCOUNTER — Encounter: Payer: Self-pay | Admitting: Women's Health

## 2017-08-02 ENCOUNTER — Other Ambulatory Visit: Payer: Self-pay | Admitting: Women's Health

## 2017-08-02 MED ORDER — IBUPROFEN 600 MG PO TABS: 600.0000 mg | ORAL_TABLET | Freq: Four times a day (QID) | ORAL | 0 refills | Status: DC

## 2017-08-03 ENCOUNTER — Encounter: Payer: Self-pay | Admitting: Obstetrics and Gynecology

## 2017-08-28 ENCOUNTER — Encounter: Payer: Self-pay | Admitting: Advanced Practice Midwife

## 2017-08-28 ENCOUNTER — Ambulatory Visit (INDEPENDENT_AMBULATORY_CARE_PROVIDER_SITE_OTHER): Payer: Medicaid Other | Admitting: Advanced Practice Midwife

## 2017-08-28 DIAGNOSIS — R300 Dysuria: Secondary | ICD-10-CM

## 2017-08-28 LAB — POCT URINALYSIS DIPSTICK
Blood, UA: NEGATIVE
GLUCOSE UA: NEGATIVE
KETONES UA: NEGATIVE
Leukocytes, UA: NEGATIVE
Nitrite, UA: NEGATIVE
Protein, UA: NEGATIVE

## 2017-08-28 MED ORDER — METRONIDAZOLE 500 MG PO TABS: 500.0000 mg | ORAL_TABLET | Freq: Two times a day (BID) | ORAL | 0 refills | Status: DC

## 2017-08-28 NOTE — Progress Notes (Signed)
Katherine [name] is a 27 y.o. who presents for a postpartum visit. She is 6 weeks postpartum following a spontaneous vaginal delivery. I have fully reviewed the prenatal and intrapartum course. The delivery was at 40.5 gestational weeks.  Anesthesia: epidural. Postpartum course has been uneventful. Baby's course has been uneventful. He spent some time in the NICU, mom had chorio.  Baby is feeding by bottle. Bleeding: had a period that ended last week.  No sex since then. Bowel function is normal. Bladder function is normal. Patient is sexually active. Contraception method is condoms. Postpartum depression screening: negative.   Current Outpatient Medications:  .  acetaminophen (TYLENOL) 500 MG tablet, Take 500 mg by mouth as needed for moderate pain. , Disp: , Rfl:  .  Calcium Carbonate Antacid (TUMS PO), Take 1 tablet by mouth daily as needed (heartburn). , Disp: , Rfl:  .  ibuprofen (ADVIL,MOTRIN) 600 MG tablet, Take 1 tablet (600 mg total) by mouth every 6 (six) hours. (Patient not taking: Reported on 08/28/2017), Disp: 30 tablet, Rfl: 0 .  omeprazole (PRILOSEC) 20 MG capsule, Take 1 capsule (20 mg total) by mouth daily. 1 tablet a day (Patient not taking: Reported on 08/28/2017), Disp: 30 capsule, Rfl: 6 .  prenatal vitamin w/FE, FA (PRENATAL 1 + 1) 27-1 MG TABS tablet, Take 1 tablet by mouth daily at 12 noon. (Patient not taking: Reported on 08/28/2017), Disp: 30 each, Rfl: 12  Review of Systems   Constitutional: Negative for fever and chills Eyes: Negative for visual disturbances Respiratory: Negative for shortness of breath, dyspnea Cardiovascular: Negative for chest pain or palpitations  Gastrointestinal: Negative for vomiting, diarrhea and constipation Genitourinary: Positive for dysuria just now. Some cloudy urine w/foul odor.   Musculoskeletal: Negative for back pain, joint pain, myalgias  Neurological: Negative for dizziness and headaches    Objective:     Vitals:   08/28/17 1409  BP:  (!) 98/50  Pulse: 76   General:  alert, cooperative and no distress   Breasts:  negative  Lungs: Normal respiratory effort  Heart:  regular rate and rhythm  Abdomen: Soft, nontender   Vulva:  normal  Vagina: Thin frothy white discharge w/amine odor. Wet prep + clue only   Cervix:  closed  Corpus: Well involuted     Rectal Exam: no hemorrhoids        Assessment:    normal postpartum exam.  Plan:   1. Contraception: Nexplanon 2. [facility]  3. rx flagyl for BV.

## 2017-08-29 ENCOUNTER — Encounter: Payer: Self-pay | Admitting: Advanced Practice Midwife

## 2017-08-29 ENCOUNTER — Ambulatory Visit (INDEPENDENT_AMBULATORY_CARE_PROVIDER_SITE_OTHER): Payer: Medicaid Other | Admitting: Advanced Practice Midwife

## 2017-08-29 VITALS — BP 126/70 | HR 80 | Ht 62.5 in | Wt 167.0 lb

## 2017-08-29 DIAGNOSIS — Z3202 Encounter for pregnancy test, result negative: Secondary | ICD-10-CM | POA: Diagnosis not present

## 2017-08-29 DIAGNOSIS — Z3049 Encounter for surveillance of other contraceptives: Secondary | ICD-10-CM | POA: Diagnosis not present

## 2017-08-29 DIAGNOSIS — Z30017 Encounter for initial prescription of implantable subdermal contraceptive: Secondary | ICD-10-CM | POA: Insufficient documentation

## 2017-08-29 LAB — POCT URINE PREGNANCY: PREG TEST UR: NEGATIVE

## 2017-08-29 MED ORDER — ETONOGESTREL 68 MG ~~LOC~~ IMPL
68.0000 mg | DRUG_IMPLANT | Freq: Once | SUBCUTANEOUS | Status: AC
  Administered 2017-08-29: 68 mg via SUBCUTANEOUS

## 2017-08-29 NOTE — Progress Notes (Signed)
  HPI:  Katherine [name] is a 27 y.o. year old Caucasian female here for Nexplanon insertion.  Her LMP was last week , and her pregnancy test today was negative.  Risks/benefits/side effects of Nexplanon have been discussed and her questions have been answered.  Specifically, a failure rate of 05/998 has been reported, with an increased failure rate if pt takes St. John's Wort and/or antiseizure medicaitons.  Katherine [name] is aware of the common side effect of irregular bleeding, which the incidence of decreases over time.   Past Medical History: Past Medical History:  Diagnosis Date  . Bacterial vaginosis   . Secondary oligomenorrhea     Past Surgical History: Past Surgical History:  Procedure Laterality Date  . TONSILLECTOMY      Family History: Family History  Problem Relation Age of Onset  . Diabetes Mother   . Vision loss Father   . Asthma Brother   . ADD / ADHD Brother   . Drug abuse Brother   . Seizures Maternal Uncle   . Heart disease Paternal Aunt   . Heart attack Maternal Grandmother   . Alcohol abuse Paternal Grandfather   . Mental retardation Cousin     Social History: Social History   Tobacco Use  . Smoking status: Never Smoker  . Smokeless tobacco: Never Used  Substance Use Topics  . Alcohol use: No    Comment: occasional before pregnancy  . Drug use: No    Types: Marijuana    Comment: last used 11/2016    Allergies: No Known Allergies    Her left arm, approximatly 4 inches proximal from the elbow, was cleansed with alcohol and anesthetized with 2cc of 2% Lidocaine.  The area was cleansed again and the Nexplanon was inserted without difficulty.  A pressure bandage was applied.  Pt was instructed to remove pressure bandage in a few hours, and keep insertion site covered with a bandaid for 3 days.  Back up contraception was recommended for 2 weeks.  Follow-up scheduled PRN problems  Jacklyn ShellFrances Cresenzo-Dishmon 08/29/2017 2:28 PM

## 2017-09-17 ENCOUNTER — Other Ambulatory Visit (INDEPENDENT_AMBULATORY_CARE_PROVIDER_SITE_OTHER): Payer: Medicaid Other

## 2017-09-17 ENCOUNTER — Telehealth: Payer: Self-pay | Admitting: Obstetrics & Gynecology

## 2017-09-17 ENCOUNTER — Other Ambulatory Visit: Payer: Self-pay | Admitting: Adult Health

## 2017-09-17 DIAGNOSIS — R300 Dysuria: Secondary | ICD-10-CM

## 2017-09-17 LAB — POCT URINALYSIS DIPSTICK
GLUCOSE UA: NEGATIVE
KETONES UA: NEGATIVE
Nitrite, UA: POSITIVE

## 2017-09-17 MED ORDER — PHENAZOPYRIDINE HCL 200 MG PO TABS: 200.0000 mg | ORAL_TABLET | Freq: Three times a day (TID) | ORAL | 0 refills | Status: DC | PRN

## 2017-09-17 MED ORDER — SULFAMETHOXAZOLE-TRIMETHOPRIM 800-160 MG PO TABS: 1.0000 | ORAL_TABLET | Freq: Two times a day (BID) | ORAL | 0 refills | Status: DC

## 2017-09-17 NOTE — Telephone Encounter (Signed)
Patient states she is still bleeding since having her nexplanon placed. She is have discomfort when she urinates, lower back pain on Thursday.  Patient is requesting to come leave a urine sample. Please advise.

## 2017-09-17 NOTE — Progress Notes (Signed)
Pt reports painful urination. She has had some back pain in the past few days. Discussed patient with Katherine [name] she recommend that we sent urine for culture and she will treat patient.

## 2017-09-17 NOTE — Telephone Encounter (Signed)
Patient called stating that she would like a call from the nurse, Pt is not feeling well. Please contact pt

## 2017-09-17 NOTE — Telephone Encounter (Signed)
Informed patient she can come leave urine for [place] to dip.  Verbalized understanding.

## 2017-09-17 NOTE — Progress Notes (Signed)
Pt in for UTI symptoms and had +nitrates, urine sent for UA C&S and will rx septra ds and pyridium

## 2017-09-18 LAB — URINALYSIS, ROUTINE W REFLEX MICROSCOPIC
Bilirubin, UA: NEGATIVE
Glucose, UA: NEGATIVE
KETONES UA: NEGATIVE
Nitrite, UA: POSITIVE — AB
PROTEIN UA: NEGATIVE
SPEC GRAV UA: 1.016 (ref 1.005–1.030)
Urobilinogen, Ur: 0.2 mg/dL (ref 0.2–1.0)
pH, UA: 5 (ref 5.0–7.5)

## 2017-09-18 LAB — MICROSCOPIC EXAMINATION
Casts: NONE SEEN /lpf
Epithelial Cells (non renal): >10 /hpf — AB (ref 0–10)
WBC, UA: >30 /hpf — AB (ref 0–5)

## 2017-09-21 ENCOUNTER — Telehealth: Payer: Self-pay | Admitting: Advanced Practice Midwife

## 2017-09-21 LAB — URINE CULTURE

## 2017-09-21 NOTE — Telephone Encounter (Signed)
Informed patient urine showed ecoli.  Informed Septra DS will take care of it.  Verbalized understanding.

## 2017-09-21 NOTE — Telephone Encounter (Signed)
Patient called stating that she would like to know the results of her urine culture. Please contact pt

## 2018-08-15 ENCOUNTER — Encounter: Payer: Self-pay | Admitting: Advanced Practice Midwife

## 2019-02-06 ENCOUNTER — Encounter: Payer: Medicaid Other | Admitting: Advanced Practice Midwife

## 2019-02-11 ENCOUNTER — Encounter: Payer: Self-pay | Admitting: *Deleted

## 2019-02-11 ENCOUNTER — Encounter: Payer: Medicaid Other | Admitting: Women's Health

## 2019-02-24 ENCOUNTER — Other Ambulatory Visit: Payer: Self-pay

## 2019-02-24 ENCOUNTER — Ambulatory Visit (INDEPENDENT_AMBULATORY_CARE_PROVIDER_SITE_OTHER): Payer: Medicaid Other | Admitting: Women's Health

## 2019-02-24 ENCOUNTER — Encounter: Payer: Medicaid Other | Admitting: Women's Health

## 2019-02-24 ENCOUNTER — Encounter: Payer: Self-pay | Admitting: Women's Health

## 2019-02-24 VITALS — BP 118/84 | HR 94 | Ht 63.0 in | Wt 187.0 lb

## 2019-02-24 DIAGNOSIS — Z3046 Encounter for surveillance of implantable subdermal contraceptive: Secondary | ICD-10-CM | POA: Diagnosis not present

## 2019-02-24 NOTE — Patient Instructions (Signed)
Start prenatal vitamins today

## 2019-02-24 NOTE — Progress Notes (Signed)
   [place] REMOVAL Patient name: Katherine [name] Call MRN [id number]  Date of birth: 07-Dec-1990 Subjective Findings:   Katherine [name] Call is a 28 y.o. G58P1001 Caucasian female being seen today for removal of a Nexplanon. Her Nexplanon was placed 08/29/17.  She desires removal because she wants to get pregnant. Not taking pnv yet.  Signed copy of informed consent in chart.   Patient's last menstrual period was 02/13/2019. Last pap 12/11/16. Results were:  normal The planned method of family planning is none Pertinent History Reviewed:   Reviewed past medical,surgical, social, obstetrical and family history.  Reviewed problem list, medications and allergies. Objective Findings & Procedure:    Vitals:   02/24/19 1103  BP: 118/84  Pulse: 94  Weight: 187 lb (84.8 kg)  Height: 5\' 3"  (1.6 m)  Body mass index is 33.13 kg/m.  No results found for this or any previous visit (from the past 24 hour(s)).   Time out was performed.  Nexplanon site identified.  Area prepped in usual sterile fashon. One cc of 2% lidocaine was used to anesthetize the area at the distal end of the implant. A small stab incision was made right beside the implant on the distal portion.  The Nexplanon rod was grasped using hemostats and removed without difficulty.  There was less than 3 cc blood loss. There were no complications.  Steri-strips were applied over the small incision and a pressure bandage was applied.  The patient tolerated the procedure well. Assessment & Plan:   1) Nexplanon removal She was instructed to keep the area clean and dry, remove pressure bandage in 24 hours, and keep insertion site covered with the steri-strip for 3-5 days.   Follow-up PRN problems. Start pnv today, call us w/ +HPT.   No orders of the defined types were placed in this encounter.   Follow-up: Return for prn.  [place], [facility] - Closed 02/24/2019 11:35 AM

## 2019-08-01 ENCOUNTER — Ambulatory Visit (INDEPENDENT_AMBULATORY_CARE_PROVIDER_SITE_OTHER): Payer: No Typology Code available for payment source | Admitting: Adult Health

## 2019-08-01 ENCOUNTER — Encounter: Payer: Self-pay | Admitting: Adult Health

## 2019-08-01 ENCOUNTER — Other Ambulatory Visit: Payer: Self-pay

## 2019-08-01 VITALS — BP 126/77 | HR 77 | Ht 63.0 in | Wt 197.0 lb

## 2019-08-01 DIAGNOSIS — L689 Hypertrichosis, unspecified: Secondary | ICD-10-CM

## 2019-08-01 DIAGNOSIS — Z319 Encounter for procreative management, unspecified: Secondary | ICD-10-CM | POA: Insufficient documentation

## 2019-08-01 DIAGNOSIS — Z3202 Encounter for pregnancy test, result negative: Secondary | ICD-10-CM | POA: Insufficient documentation

## 2019-08-01 DIAGNOSIS — N926 Irregular menstruation, unspecified: Secondary | ICD-10-CM | POA: Diagnosis not present

## 2019-08-01 LAB — POCT URINE PREGNANCY: Preg Test, Ur: NEGATIVE

## 2019-08-01 MED ORDER — MEDROXYPROGESTERONE ACETATE 10 MG PO TABS
ORAL_TABLET | ORAL | 0 refills | Status: DC
Start: 2019-08-01 — End: 2019-08-26

## 2019-08-01 MED ORDER — PRENATAL PLUS 27-1 MG PO TABS: 1.0000 | ORAL_TABLET | Freq: Every day | ORAL | 12 refills | Status: DC

## 2019-08-01 NOTE — Progress Notes (Signed)
  Subjective:     Patient ID: Katherine [name], female   DOB: 1990/06/20, 29 y.o.   MRN: [id number]  HPI Katherine [name] is a 29 year old white female,married, G1P1 in complaining of no period since October, had nexplanon removed in October. Periods have been irregular, since lat teens, and she had negative work up and [place] in 2018 in [place]. Then periods got regular and she she got pregnant and has 74 year old son and desires another pregnancy soon.  Review of Systems No period since October Had nexplanon removed 02/24/19 Periods irregular,since about age 36 Hair on face and arms Reviewed past medical,surgical, social and family history. Reviewed medications and allergies.     Objective:   Physical Exam BP 126/77 (BP Location: Left Arm, Patient Position: Sitting, Cuff Size: Normal)   Pulse 77   Ht 5\' 3"  (1.6 m)   Wt 197 lb (89.4 kg)   LMP 02/13/2019 (Approximate) Comment: no period since October  BMI 34.90 kg/m UPT is negative.fall risk is low, alcohol audit is 6.PHQ 2 score is 0. Skin warm and dry. Neck: mid line trachea, normal thyroid, good ROM, no lymphadenopathy noted. Lungs: clear to ausculation bilaterally. Cardiovascular: regular rate and rhythm. Has increased facial hair and dark hair on arms.     Assessment:     1. Pregnancy examination or test, negative result  2. Missed periods Will rx provera 10 mg 1 daily for 10 days to see if can get withdrawal bleed Meds ordered this encounter  Medications  . medroxyPROGESTERone (PROVERA) 10 MG tablet    Sig: Take 1 daily for 10 days    Dispense:  10 tablet    Refill:  0    Order Specific Question:   Supervising Provider    Answer:   November, LUTHER H [[id number]]  . prenatal vitamin w/FE, FA (PRENATAL 1 + 1) 27-1 MG TABS tablet    Sig: Take 1 tablet by mouth daily at 12 noon.    Dispense:  30 tablet    Refill:  12    Order Specific Question:   Supervising Provider    Answer:   Despina Hidden, LUTHER H [[id number]]  3. Irregular periods   4. Excess body and  facial hair Do not save  5. Desires pregnancy  Will rx PNV     Plan:     Return  To see me 4/20 to see if had period or not May get labs then, to include TSH,prolactin, progesterone and testosterone

## 2019-08-26 ENCOUNTER — Other Ambulatory Visit: Payer: Self-pay

## 2019-08-26 ENCOUNTER — Encounter: Payer: Self-pay | Admitting: Adult Health

## 2019-08-26 ENCOUNTER — Ambulatory Visit (INDEPENDENT_AMBULATORY_CARE_PROVIDER_SITE_OTHER): Payer: No Typology Code available for payment source | Admitting: Adult Health

## 2019-08-26 VITALS — BP 114/71 | HR 74 | Ht 63.0 in | Wt 192.5 lb

## 2019-08-26 DIAGNOSIS — Z319 Encounter for procreative management, unspecified: Secondary | ICD-10-CM | POA: Diagnosis not present

## 2019-08-26 NOTE — Progress Notes (Signed)
  Subjective:     Patient ID: Katherine [name], female   DOB: November 24, 1990, 29 y.o.   MRN: [id number]  HPI Katherine [name] is a 29 year old white female,married, had not had a period since [place] nexplanon removed in October,  and was seen 3/26 and given provera to get withdrawal bleed and she had had period. She wants to be pregnant.   Review of Systems Got period after provera Reviewed past medical,surgical, social and family history. Reviewed medications and allergies.     Objective:   Physical Exam BP 114/71 (BP Location: Left Arm, Patient Position: Sitting, Cuff Size: Normal)   Pulse 74   Ht 5\' 3"  (1.6 m)   Wt 192 lb 8 oz (87.3 kg)   LMP 08/14/2019   BMI 34.10 kg/m  Skin warm and dry.. Lungs: clear to ausculation bilaterally. Cardiovascular: regular rate and rhythm.    Assessment:     1. Patient desires pregnancy Call with next period, will check progesterone level day 21 of cycle    Plan:     Call with next period Continue PNV Have sex every other day

## 2019-09-17 ENCOUNTER — Telehealth: Payer: Self-pay | Admitting: Adult Health

## 2019-09-17 NOTE — Telephone Encounter (Signed)
Message sent on mychart 

## 2019-09-24 ENCOUNTER — Other Ambulatory Visit: Payer: Self-pay | Admitting: Adult Health

## 2019-09-24 DIAGNOSIS — Z319 Encounter for procreative management, unspecified: Secondary | ICD-10-CM

## 2019-09-24 MED ORDER — CLOMIPHENE CITRATE 50 MG PO TABS: ORAL_TABLET | ORAL | 2 refills | Status: DC

## 2019-09-24 NOTE — Progress Notes (Signed)
Will rx clomid ck progesterone level 6/10

## 2019-10-17 LAB — PROGESTERONE: Progesterone: 0.3 ng/mL

## 2019-10-28 ENCOUNTER — Other Ambulatory Visit: Payer: Self-pay | Admitting: Adult Health

## 2019-10-28 DIAGNOSIS — Z319 Encounter for procreative management, unspecified: Secondary | ICD-10-CM

## 2019-10-28 NOTE — Progress Notes (Signed)
Ck progesterone level 7/12.

## 2019-11-05 ENCOUNTER — Telehealth: Payer: Self-pay | Admitting: Adult Health

## 2019-11-05 NOTE — Telephone Encounter (Signed)
Pt advised to continue taking Clomid once daily. As far as labs, pt was advised to go next day from when she was going. Pt voiced understanding. JSY

## 2019-11-05 NOTE — Telephone Encounter (Signed)
Pt started Clomid Monday pm. Forgot to take Tuesday's dose. Does she continue or start over? Please advise. Thanks!! JSY

## 2019-11-05 NOTE — Telephone Encounter (Signed)
Pt wants to talk about meds she is taking and she forgot to take yesterday and needs tosee what she needs to do

## 2019-11-22 LAB — PROGESTERONE: Progesterone: 0.1 ng/mL

## 2019-11-24 ENCOUNTER — Other Ambulatory Visit: Payer: Self-pay | Admitting: Adult Health

## 2019-11-24 DIAGNOSIS — Z319 Encounter for procreative management, unspecified: Secondary | ICD-10-CM

## 2019-11-24 MED ORDER — CLOMIPHENE CITRATE 50 MG PO TABS: ORAL_TABLET | ORAL | 2 refills | Status: DC

## 2019-11-24 NOTE — Progress Notes (Signed)
Ck progesterone level 8/12

## 2019-11-24 NOTE — Progress Notes (Signed)
Increase clomid to 100 mg  

## 2019-12-19 LAB — PROGESTERONE: Progesterone: 13.2 ng/mL

## 2019-12-25 ENCOUNTER — Telehealth: Payer: Self-pay | Admitting: Adult Health

## 2019-12-25 DIAGNOSIS — Z319 Encounter for procreative management, unspecified: Secondary | ICD-10-CM

## 2019-12-25 NOTE — Telephone Encounter (Signed)
Pt would like for [place] to tell Katherine [name] the Preg test she took this am was neg and she wanted her to know she started her cycle yesterday . She would like for Jenn to call her back to discuss

## 2019-12-25 NOTE — Addendum Note (Signed)
Addended by: Cyril Mourning A on: 12/25/2019 02:12 PM   Modules accepted: Orders

## 2019-12-25 NOTE — Telephone Encounter (Signed)
Katherine [name] started period yesterday, start clomid tomorrow and ck progesterone level 9/7 and have sex every other day,7-24 of cycle

## 2020-01-15 LAB — PROGESTERONE: Progesterone: 0.3 ng/mL

## 2020-01-24 ENCOUNTER — Other Ambulatory Visit: Payer: Self-pay | Admitting: Critical Care Medicine

## 2020-01-24 ENCOUNTER — Other Ambulatory Visit: Payer: No Typology Code available for payment source

## 2020-01-24 DIAGNOSIS — Z20822 Contact with and (suspected) exposure to covid-19: Secondary | ICD-10-CM

## 2020-01-26 ENCOUNTER — Other Ambulatory Visit: Payer: Self-pay | Admitting: Adult Health

## 2020-01-26 DIAGNOSIS — Z319 Encounter for procreative management, unspecified: Secondary | ICD-10-CM

## 2020-01-26 LAB — NOVEL CORONAVIRUS, NAA: SARS-CoV-2, NAA: NOT DETECTED

## 2020-01-26 LAB — SARS-COV-2, NAA 2 DAY TAT

## 2020-01-26 MED ORDER — CLOMIPHENE CITRATE 50 MG PO TABS: ORAL_TABLET | ORAL | 2 refills | Status: DC

## 2020-01-26 NOTE — Progress Notes (Signed)
Ck progesterone level 02/16/20

## 2020-02-03 ENCOUNTER — Other Ambulatory Visit: Payer: Self-pay | Admitting: Women's Health

## 2020-02-03 DIAGNOSIS — N979 Female infertility, unspecified: Secondary | ICD-10-CM

## 2020-02-19 LAB — PROGESTERONE: Progesterone: 0.1 ng/mL

## 2020-02-20 ENCOUNTER — Telehealth: Payer: Self-pay | Admitting: Adult Health

## 2020-02-20 NOTE — Telephone Encounter (Signed)
Take clomid 150 mg next cycle  and call [facility](number given) or make appt to see Dr Despina Hidden

## 2020-02-24 ENCOUNTER — Other Ambulatory Visit: Payer: Self-pay | Admitting: Adult Health

## 2020-02-24 MED ORDER — METFORMIN HCL 500 MG PO TABS: 500.0000 mg | ORAL_TABLET | Freq: Every day | ORAL | 11 refills | Status: DC

## 2020-02-24 NOTE — Progress Notes (Signed)
Will rx metformin

## 2020-03-04 ENCOUNTER — Ambulatory Visit: Payer: No Typology Code available for payment source | Admitting: Obstetrics & Gynecology

## 2020-03-05 ENCOUNTER — Ambulatory Visit: Payer: No Typology Code available for payment source | Admitting: Obstetrics & Gynecology

## 2020-03-12 ENCOUNTER — Ambulatory Visit (INDEPENDENT_AMBULATORY_CARE_PROVIDER_SITE_OTHER): Payer: No Typology Code available for payment source | Admitting: Obstetrics & Gynecology

## 2020-03-12 ENCOUNTER — Encounter: Payer: Self-pay | Admitting: Obstetrics & Gynecology

## 2020-03-12 VITALS — BP 114/76 | HR 81 | Ht 63.0 in | Wt 191.5 lb

## 2020-03-12 DIAGNOSIS — L680 Hirsutism: Secondary | ICD-10-CM | POA: Diagnosis not present

## 2020-03-12 DIAGNOSIS — Z3202 Encounter for pregnancy test, result negative: Secondary | ICD-10-CM

## 2020-03-12 DIAGNOSIS — N970 Female infertility associated with anovulation: Secondary | ICD-10-CM

## 2020-03-12 LAB — POCT URINE PREGNANCY: Preg Test, Ur: NEGATIVE

## 2020-03-12 MED ORDER — MEDROXYPROGESTERONE ACETATE 10 MG PO TABS: 10.0000 mg | ORAL_TABLET | Freq: Every day | ORAL | 0 refills | Status: DC

## 2020-03-12 NOTE — Progress Notes (Signed)
Chief Complaint  Patient presents with  . not having monthly periods    pt wants to get pregnant      29 y.o. G1P1001 Patient's last menstrual period was 01/29/2020. The current method of family planning is none.  Outpatient Encounter Medications as of 03/12/2020  Medication Sig  . acetaminophen (TYLENOL) 500 MG tablet Take 500 mg by mouth as needed for moderate pain.   . Calcium Carbonate Antacid (TUMS PO) Take 1 tablet by mouth daily as needed (heartburn).   . Loratadine (CLARITIN PO) Take by mouth.  . metFORMIN (GLUCOPHAGE) 500 MG tablet Take 1 tablet (500 mg total) by mouth daily with breakfast.  . prenatal vitamin w/FE, FA (PRENATAL 1 + 1) 27-1 MG TABS tablet Take 1 tablet by mouth daily at 12 noon. (Patient taking differently: Take 1 tablet by mouth. )  . VITAMIN D PO Take by mouth.  . clomiPHENE (CLOMID) 50 MG tablet Take 3 tablets on days 3-7 of cycle (Patient not taking: Reported on 03/12/2020)  . medroxyPROGESTERone (PROVERA) 10 MG tablet Take 1 tablet (10 mg total) by mouth daily.   No facility-administered encounter medications on file as of 03/12/2020.    Subjective No menses for 2 months History of irregular periods in the past On Metformin 500 daily with the intent of using this is the basis for ovulation induction She appears to have polycystic ovarian syndrome/metabolic syndrome Weight is 87 kg Has calm without periods for prolonged periods of time many times previously Past Medical History:  Diagnosis Date  . Bacterial vaginosis   . Secondary oligomenorrhea     Past Surgical History:  Procedure Laterality Date  . TONSILLECTOMY      OB History    Gravida  1   Para  1   Term  1   Preterm      AB      Living  1     SAB      IAB      Ectopic      Multiple  0   Live Births  1           No Known Allergies  Social History   Socioeconomic History  . Marital status: Single    Spouse name: Not on file  . Number of children:  Not on file  . Years of education: Not on file  . Highest education level: Not on file  Occupational History  . Not on file  Tobacco Use  . Smoking status: Former Smoker    Types: Cigars  . Smokeless tobacco: Never Used  Vaping Use  . Vaping Use: Never used  Substance and Sexual Activity  . Alcohol use: Yes    Comment: weekly  . Drug use: No    Types: Marijuana    Comment: last used 11/2016  . Sexual activity: Yes    Birth control/protection: None  Other Topics Concern  . Not on file  Social History Narrative  . Not on file   Social Determinants of Health   Financial Resource Strain: Low Risk   . Difficulty of Paying Living Expenses: Not very hard  Food Insecurity: No Food Insecurity  . Worried About Programme researcher, broadcasting/film/video in the Last Year: Never true  . Ran Out of Food in the Last Year: Never true  Transportation Needs: No Transportation Needs  . Lack of Transportation (Medical): No  . Lack of Transportation (Non-Medical): No  Physical Activity: Inactive  . Days  of Exercise per Week: 0 days  . Minutes of Exercise per Session: 0 min  Stress: No Stress Concern Present  . Feeling of Stress : Only a little  Social Connections: Moderately Integrated  . Frequency of Communication with Friends and Family: More than three times a week  . Frequency of Social Gatherings with Friends and Family: Twice a week  . Attends Religious Services: 1 to 4 times per year  . Active Member of Clubs or Organizations: No  . Attends Banker Meetings: Never  . Marital Status: Married    Family History  Problem Relation Age of Onset  . Diabetes Mother   . Cancer Mother   . Vision loss Father   . Asthma Brother   . ADD / ADHD Brother   . Drug abuse Brother   . Seizures Maternal Uncle   . Heart disease Paternal Aunt   . Heart attack Maternal Grandmother   . Alcohol abuse Paternal Grandfather   . Mental retardation Cousin     Medications:       Current Outpatient  Medications:  .  acetaminophen (TYLENOL) 500 MG tablet, Take 500 mg by mouth as needed for moderate pain. , Disp: , Rfl:  .  Calcium Carbonate Antacid (TUMS PO), Take 1 tablet by mouth daily as needed (heartburn). , Disp: , Rfl:  .  Loratadine (CLARITIN PO), Take by mouth., Disp: , Rfl:  .  metFORMIN (GLUCOPHAGE) 500 MG tablet, Take 1 tablet (500 mg total) by mouth daily with breakfast., Disp: 30 tablet, Rfl: 11 .  prenatal vitamin w/FE, FA (PRENATAL 1 + 1) 27-1 MG TABS tablet, Take 1 tablet by mouth daily at 12 noon. (Patient taking differently: Take 1 tablet by mouth. ), Disp: 30 tablet, Rfl: 12 .  VITAMIN D PO, Take by mouth., Disp: , Rfl:  .  clomiPHENE (CLOMID) 50 MG tablet, Take 3 tablets on days 3-7 of cycle (Patient not taking: Reported on 03/12/2020), Disp: 15 tablet, Rfl: 2 .  medroxyPROGESTERone (PROVERA) 10 MG tablet, Take 1 tablet (10 mg total) by mouth daily., Disp: 10 tablet, Rfl: 0  Objective Blood pressure 114/76, pulse 81, height 5\' 3"  (1.6 m), weight 191 lb 8 oz (86.9 kg), last menstrual period 01/29/2020.  General WDWN female NAD Vulva:  normal appearing vulva with no masses, tenderness or lesions Vagina:  normal mucosa, no discharge Cervix:  Normal no lesions Uterus:  normal size, contour, position, consistency, mobility, non-tender Adnexa: ovaries:present,  normal adnexa in size, nontender and no masses   Pertinent ROS No burning with urination, frequency or urgency No nausea, vomiting or diarrhea Nor fever chills or other constitutional symptoms   Labs or studies No new    Impression Diagnoses this Encounter::   ICD-10-CM   1. Anovulatory  N97.0   2. Pregnancy examination or test, negative result  Z32.02 POCT urine pregnancy  3. 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 medroxyPROGESTERone (PROVERA) 10 MG tablet    Sig: Take 1 tablet (10 mg total) by mouth daily.     Dispense:  10 tablet    Refill:  0    Labs or Scans Ordered: Orders Placed This Encounter  Procedures  . POCT urine pregnancy    Management:: Provera challenge test We will discuss Clomid ovulation stimulation when she is seen back in approximately 2 months Call if she does not start her menses  Follow up Return  in about 2 months (around 05/11/2020) for yearly, with Dr Despina Hidden.         Face to face time:  10 minutes  Greater than 50% of the visit time was spent in counseling and coordination of care with the patient.  The summary and outline of the counseling and care coordination is summarized in the note above.   All questions were answered.

## 2020-03-18 ENCOUNTER — Telehealth: Payer: Self-pay | Admitting: Obstetrics & Gynecology

## 2020-03-18 NOTE — Telephone Encounter (Signed)
Pt wanted to check with Dr. Despina Hidden to see if he wanted to change the dose or directions on her 500mg  Metformin  States that started pt on this & he had mentioned continuing it but she just wants to make sure he still only wants her taking one daily  Please advise & notify pt

## 2020-03-19 NOTE — Telephone Encounter (Signed)
Continue as prescribed.

## 2020-04-21 ENCOUNTER — Telehealth: Payer: Self-pay | Admitting: Adult Health

## 2020-04-21 NOTE — Telephone Encounter (Signed)
Metformin will not cause b12 and d deficiency in short tem only in the long long term  She needs to use some Nizoral based shampoo

## 2020-04-21 NOTE — Telephone Encounter (Signed)
Pt called, concerned with now having bad dandruff States that Victorino Dike started her on Metformin & Dr. Despina Hidden recommended continuing the Metformin, she "Googled" side effects of Metformin & read that it can cause B12 & Vitamin D deficiency which can cause the dandruff  Suggestions?  Also wanted to let Dr. Despina Hidden know that she did have a cycle this month, was about 8 days, was not heavy or painful   Please advise & notify pt

## 2020-05-21 ENCOUNTER — Other Ambulatory Visit: Payer: No Typology Code available for payment source | Admitting: Obstetrics & Gynecology

## 2020-06-04 ENCOUNTER — Ambulatory Visit (INDEPENDENT_AMBULATORY_CARE_PROVIDER_SITE_OTHER): Payer: No Typology Code available for payment source | Admitting: Obstetrics & Gynecology

## 2020-06-04 ENCOUNTER — Other Ambulatory Visit: Payer: Self-pay

## 2020-06-04 ENCOUNTER — Encounter: Payer: Self-pay | Admitting: Obstetrics & Gynecology

## 2020-06-04 ENCOUNTER — Other Ambulatory Visit (HOSPITAL_COMMUNITY)
Admission: RE | Admit: 2020-06-04 | Discharge: 2020-06-04 | Disposition: A | Discharge status: Home / Self Care | Payer: No Typology Code available for payment source | Source: Ambulatory Visit | Attending: Obstetrics & Gynecology | Admitting: Obstetrics & Gynecology

## 2020-06-04 VITALS — BP 108/71 | HR 88 | Ht 63.0 in | Wt 186.0 lb

## 2020-06-04 DIAGNOSIS — E282 Polycystic ovarian syndrome: Secondary | ICD-10-CM | POA: Diagnosis not present

## 2020-06-04 DIAGNOSIS — Z01419 Encounter for gynecological examination (general) (routine) without abnormal findings: Secondary | ICD-10-CM | POA: Insufficient documentation

## 2020-06-04 DIAGNOSIS — R87610 Atypical squamous cells of undetermined significance on cytologic smear of cervix (ASC-US): Secondary | ICD-10-CM

## 2020-06-04 DIAGNOSIS — Z01411 Encounter for gynecological examination (general) (routine) with abnormal findings: Secondary | ICD-10-CM

## 2020-06-04 NOTE — Progress Notes (Signed)
Subjective:     Katherine [name] is a 30 y.o. female here for a routine exam.  Patient's last menstrual period was 05/12/2020. G1P1001 Birth Control Method:  none Menstrual Calendar(currently): regular now on metformin 500 daily   Current complaints: none now.   Current acute medical issues:  PCOS/androgen excess   Recent Gynecologic History Patient's last menstrual period was 05/12/2020. Last Pap: 2018,  normal Last mammogram: ,    Past Medical History:  Diagnosis Date  . Bacterial vaginosis   . Secondary oligomenorrhea     Past Surgical History:  Procedure Laterality Date  . TONSILLECTOMY      OB History    Gravida  1   Para  1   Term  1   Preterm      AB      Living  1     SAB      IAB      Ectopic      Multiple  0   Live Births  1           Social History   Socioeconomic History  . Marital status: Single    Spouse name: Not on file  . Number of children: Not on file  . Years of education: Not on file  . Highest education level: Not on file  Occupational History  . Not on file  Tobacco Use  . Smoking status: Former Smoker    Types: Cigars  . Smokeless tobacco: Never Used  Vaping Use  . Vaping Use: Never used  Substance and Sexual Activity  . Alcohol use: Yes    Comment: weekly  . Drug use: No    Types: Marijuana    Comment: last used 11/2016  . Sexual activity: Yes    Birth control/protection: None  Other Topics Concern  . Not on file  Social History Narrative  . Not on file   Social Determinants of Health   Financial Resource Strain: Medium Risk  . Difficulty of Paying Living Expenses: Somewhat hard  Food Insecurity: Food Insecurity Present  . Worried About Programme researcher, broadcasting/film/video in the Last Year: Sometimes true  . Ran Out of Food in the Last Year: Never true  Transportation Needs: No Transportation Needs  . Lack of Transportation (Medical): No  . Lack of Transportation (Non-Medical): No  Physical Activity: Insufficiently Active   . Days of Exercise per Week: 1 day  . Minutes of Exercise per Session: 60 min  Stress: Stress Concern Present  . Feeling of Stress : To some extent  Social Connections: Moderately Integrated  . Frequency of Communication with Friends and Family: More than three times a week  . Frequency of Social Gatherings with Friends and Family: Once a week  . Attends Religious Services: 1 to 4 times per year  . Active Member of Clubs or Organizations: No  . Attends Banker Meetings: Never  . Marital Status: Married    Family History  Problem Relation Age of Onset  . Diabetes Mother   . Cancer Mother   . Vision loss Father   . Asthma Brother   . ADD / ADHD Brother   . Drug abuse Brother   . Seizures Maternal Uncle   . Heart disease Paternal Aunt   . Heart attack Maternal Grandmother   . Alcohol abuse Paternal Grandfather   . Mental retardation Cousin      Current Outpatient Medications:  .  Calcium Carbonate Antacid (TUMS PO), Take 1  tablet by mouth daily as needed (heartburn). , Disp: , Rfl:  .  Loratadine (CLARITIN PO), Take by mouth., Disp: , Rfl:  .  metFORMIN (GLUCOPHAGE) 500 MG tablet, Take 1 tablet (500 mg total) by mouth daily with breakfast., Disp: 30 tablet, Rfl: 11 .  prenatal vitamin w/FE, FA (PRENATAL 1 + 1) 27-1 MG TABS tablet, Take 1 tablet by mouth daily at 12 noon. (Patient taking differently: Take 1 tablet by mouth.), Disp: 30 tablet, Rfl: 12 .  acetaminophen (TYLENOL) 500 MG tablet, Take 500 mg by mouth as needed for moderate pain.  (Patient not taking: Reported on 06/04/2020), Disp: , Rfl:  .  clomiPHENE (CLOMID) 50 MG tablet, Take 3 tablets on days 3-7 of cycle (Patient not taking: No sig reported), Disp: 15 tablet, Rfl: 2 .  medroxyPROGESTERone (PROVERA) 10 MG tablet, Take 1 tablet (10 mg total) by mouth daily. (Patient not taking: Reported on 06/04/2020), Disp: 10 tablet, Rfl: 0 .  VITAMIN D PO, Take by mouth. (Patient not taking: Reported on 06/04/2020),  Disp: , Rfl:   Review of Systems  Review of Systems  Constitutional: Negative for fever, chills, weight loss, malaise/fatigue and diaphoresis.  HENT: Negative for hearing loss, ear pain, nosebleeds, congestion, sore throat, neck pain, tinnitus and ear discharge.   Eyes: Negative for blurred vision, double vision, photophobia, pain, discharge and redness.  Respiratory: Negative for cough, hemoptysis, sputum production, shortness of breath, wheezing and stridor.   Cardiovascular: Negative for chest pain, palpitations, orthopnea, claudication, leg swelling and PND.  Gastrointestinal: negative for abdominal pain. Negative for heartburn, nausea, vomiting, diarrhea, constipation, blood in stool and melena.  Genitourinary: Negative for dysuria, urgency, frequency, hematuria and flank pain.  Musculoskeletal: Negative for myalgias, back pain, joint pain and falls.  Skin: Negative for itching and rash.  Neurological: Negative for dizziness, tingling, tremors, sensory change, speech change, focal weakness, seizures, loss of consciousness, weakness and headaches.  Endo/Heme/Allergies: Negative for environmental allergies and polydipsia. Does not bruise/bleed easily.  Psychiatric/Behavioral: Negative for depression, suicidal ideas, hallucinations, memory loss and substance abuse. The patient is not nervous/anxious and does not have insomnia.        Objective:  Blood pressure 108/71, pulse 88, height 5\' 3"  (1.6 m), weight 186 lb (84.4 kg), last menstrual period 05/12/2020.   Physical Exam  Vitals reviewed. Constitutional: She is oriented to person, place, and time. She appears well-developed and well-nourished.  HENT:  Head: Normocephalic and atraumatic.        Right Ear: External ear normal.  Left Ear: External ear normal.  Nose: Nose normal.  Mouth/Throat: Oropharynx is clear and moist.  Eyes: Conjunctivae and EOM are normal. Pupils are equal, round, and reactive to light. Right eye exhibits no  discharge. Left eye exhibits no discharge. No scleral icterus.  Neck: Normal range of motion. Neck supple. No tracheal deviation present. No thyromegaly present.  Cardiovascular: Normal rate, regular rhythm, normal heart sounds and intact distal pulses.  Exam reveals no gallop and no friction rub.   No murmur heard. Respiratory: Effort normal and breath sounds normal. No respiratory distress. She has no wheezes. She has no rales. She exhibits no tenderness.  GI: Soft. Bowel sounds are normal. She exhibits no distension and no mass. There is no tenderness. There is no rebound and no guarding.  Genitourinary:  Breasts no masses skin changes or nipple changes bilaterally      Vulva is normal without lesions Vagina is pink moist without discharge Cervix normal in appearance and  pap is done Uterus is normal size shape and contour Adnexa is negative with normal sized ovaries   Musculoskeletal: Normal range of motion. She exhibits no edema and no tenderness.  Neurological: She is alert and oriented to person, place, and time. She has normal reflexes. She displays normal reflexes. No cranial nerve deficit. She exhibits normal muscle tone. Coordination normal.  Skin: Skin is warm and dry. No rash noted. No erythema. No pallor.  Psychiatric: She has a normal mood and affect. Her behavior is normal. Judgment and thought content normal.       Medications Ordered at today's visit: No orders of the defined types were placed in this encounter.   Other orders placed at today's visit: No orders of the defined types were placed in this encounter.     Assessment:    Normal Gyn exam.    Plan:    Contraception: none. Follow up in: 3 years.     Return in about 3 years (around 06/05/2023) for yearly.

## 2020-06-09 LAB — CYTOLOGY - PAP
Comment: NEGATIVE
Diagnosis: UNDETERMINED — AB
High risk HPV: NEGATIVE

## 2020-06-15 ENCOUNTER — Other Ambulatory Visit: Payer: No Typology Code available for payment source

## 2020-06-15 DIAGNOSIS — Z20822 Contact with and (suspected) exposure to covid-19: Secondary | ICD-10-CM

## 2020-06-16 LAB — SARS-COV-2, NAA 2 DAY TAT

## 2020-06-16 LAB — NOVEL CORONAVIRUS, NAA: SARS-CoV-2, NAA: DETECTED — AB

## 2020-06-17 ENCOUNTER — Telehealth: Payer: Self-pay

## 2020-06-17 NOTE — Telephone Encounter (Signed)
Called to discuss with patient about COVID-19 symptoms and the use of one of the available treatments for those with mild to moderate Covid symptoms and at a high risk of hospitalization.  Pt appears to qualify for outpatient treatment due to co-morbid conditions and/or a member of an at-risk group in accordance with the FDA Emergency Use Authorization.    Symptom onset: 06/14/20 Vaccinated: Yes Booster? No Immunocompromised? No Qualifiers: None  Declines any further treatment.  Katherine [name]

## 2020-08-30 ENCOUNTER — Other Ambulatory Visit: Payer: Self-pay

## 2020-08-30 ENCOUNTER — Encounter (INDEPENDENT_AMBULATORY_CARE_PROVIDER_SITE_OTHER): Payer: Self-pay | Admitting: Otolaryngology

## 2020-08-30 ENCOUNTER — Ambulatory Visit (INDEPENDENT_AMBULATORY_CARE_PROVIDER_SITE_OTHER): Payer: No Typology Code available for payment source | Admitting: Otolaryngology

## 2020-08-30 VITALS — Temp 97.7°F

## 2020-08-30 DIAGNOSIS — J310 Chronic rhinitis: Secondary | ICD-10-CM

## 2020-08-30 DIAGNOSIS — J342 Deviated nasal septum: Secondary | ICD-10-CM | POA: Diagnosis not present

## 2020-08-30 NOTE — Progress Notes (Signed)
HPI: Katherine [name] is a 30 y.o. female who presents is referred by [facility] internal medicine for evaluation of nasal obstruction and ear pressure.  Patient states that she cannot breathe well through her nose.  This is worse at night and she snores regularly.  She also complains of some pressure in her ears and popping in her ears.  She is referred here for evaluation of deviated septum.  As well as eustachian tube dysfunction.. She states that she has a tendency to breathe more through her mouth at night and has a dry mouth when she wakes up in the morning.  Past Medical History:  Diagnosis Date  . Bacterial vaginosis   . Secondary oligomenorrhea    Past Surgical History:  Procedure Laterality Date  . TONSILLECTOMY     Social History   Socioeconomic History  . Marital status: Single    Spouse name: Not on file  . Number of children: Not on file  . Years of education: Not on file  . Highest education level: Not on file  Occupational History  . Not on file  Tobacco Use  . Smoking status: Former Smoker    Types: Cigars, E-cigarettes  . Smokeless tobacco: Never Used  . Tobacco comment: Vaped for 1 year in 2021 to 08/2020  Vaping Use  . Vaping Use: Never used  Substance and Sexual Activity  . Alcohol use: Yes    Comment: weekly  . Drug use: No    Types: Marijuana    Comment: last used 11/2016  . Sexual activity: Yes    Birth control/protection: None  Other Topics Concern  . Not on file  Social History Narrative  . Not on file   Social Determinants of Health   Financial Resource Strain: Medium Risk  . Difficulty of Paying Living Expenses: Somewhat hard  Food Insecurity: Food Insecurity Present  . Worried About Programme researcher, broadcasting/film/video in the Last Year: Sometimes true  . Ran Out of Food in the Last Year: Never true  Transportation Needs: No Transportation Needs  . Lack of Transportation (Medical): No  . Lack of Transportation (Non-Medical): No  Physical Activity: Insufficiently  Active  . Days of Exercise per Week: 1 day  . Minutes of Exercise per Session: 60 min  Stress: Stress Concern Present  . Feeling of Stress : To some extent  Social Connections: Moderately Integrated  . Frequency of Communication with Friends and Family: More than three times a week  . Frequency of Social Gatherings with Friends and Family: Once a week  . Attends Religious Services: 1 to 4 times per year  . Active Member of Clubs or Organizations: No  . Attends Banker Meetings: Never  . Marital Status: Married   Family History  Problem Relation Age of Onset  . Diabetes Mother   . Cancer Mother   . Vision loss Father   . Asthma Brother   . ADD / ADHD Brother   . Drug abuse Brother   . Seizures Maternal Uncle   . Heart disease Paternal Aunt   . Heart attack Maternal Grandmother   . Alcohol abuse Paternal Grandfather   . Mental retardation Cousin    No Known Allergies Prior to Admission medications   Medication Sig Start Date End Date Taking? Authorizing Provider  acetaminophen (TYLENOL) 500 MG tablet Take 500 mg by mouth as needed for moderate pain.  Patient not taking: Reported on 06/04/2020    [provider]  Calcium Carbonate Antacid (  TUMS PO) Take 1 tablet by mouth daily as needed (heartburn).     [provider]  clomiPHENE (CLOMID) 50 MG tablet Take 3 tablets on days 3-7 of cycle Patient not taking: No sig reported 01/26/20   Cyril Mourning A, NP  Loratadine (CLARITIN PO) Take by mouth.    [provider]  medroxyPROGESTERone (PROVERA) 10 MG tablet Take 1 tablet (10 mg total) by mouth daily. Patient not taking: Reported on 06/04/2020 03/12/20   Lazaro Arms, MD  metFORMIN (GLUCOPHAGE) 500 MG tablet Take 1 tablet (500 mg total) by mouth daily with breakfast. 02/24/20 02/23/21  Adline Potter, NP  prenatal vitamin w/FE, FA (PRENATAL 1 + 1) 27-1 MG TABS tablet Take 1 tablet by mouth daily at 12 noon. Patient taking differently:  Take 1 tablet by mouth. 08/01/19   Adline Potter, NP  VITAMIN D PO Take by mouth. Patient not taking: Reported on 06/04/2020    [provider]     Positive ROS: Otherwise negative  All other systems have been reviewed and were otherwise negative with the exception of those mentioned in the HPI and as above.  Physical Exam: Constitutional: Alert, well-appearing, no acute distress Ears: External ears without lesions or tenderness.  She had some wax down in the right ear canal that was removed with suction.  The TMs were otherwise clear with good mobility pneumatic otoscopy.  Hearing screening with the 512 1024 tuning fork revealed good hearing in both ears with AC > BC bilaterally. Nasal: External nose without lesions. Septum is deviated to the left.  She has moderate turbinate hypertrophy with chronic rhinitis.  After decongesting the nose both middle meatus regions were clear.  No polyps noted..  Oral: Lips and gums without lesions. Tongue and palate mucosa without lesions. Posterior oropharynx clear.  She is status post tonsillectomy. Neck: No palpable adenopathy or masses Respiratory: Breathing comfortably  Skin: No facial/neck lesions or rash noted.  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  Plan: I discussed with her concerning initially trial use of nasal steroid spray as this will help some with swelling within the nasal cavity and would be safe to take on a regular basis.  I prescribed Flonase 2 sprays each nostril at night and recommended doing this regularly for the next 2 months.  She might benefit by septoplasty and turbinate reductions as she does have deviated septum and large turbinates but this is not severe. Also concerning her snoring would recommend weight loss and discussed this briefly with her. In 2 to 3 months if she still does not like she is breathing adequately through her nose she will follow-up to discuss  possible surgical intervention.   Narda Bonds, MD   CC:

## 2021-02-07 ENCOUNTER — Other Ambulatory Visit: Payer: Self-pay | Admitting: Obstetrics & Gynecology

## 2021-02-07 MED ORDER — LETROZOLE 2.5 MG PO TABS: 2.5000 mg | ORAL_TABLET | Freq: Every day | ORAL | 5 refills | Status: DC

## 2021-03-26 ENCOUNTER — Encounter (HOSPITAL_COMMUNITY): Payer: Self-pay | Admitting: Emergency Medicine

## 2021-03-26 ENCOUNTER — Emergency Department (HOSPITAL_COMMUNITY)
Admission: EM | Admit: 2021-03-26 | Discharge: 2021-03-26 | Disposition: A | Discharge status: Home / Self Care | Payer: No Typology Code available for payment source | Attending: Emergency Medicine | Admitting: Emergency Medicine

## 2021-03-26 ENCOUNTER — Other Ambulatory Visit: Payer: Self-pay

## 2021-03-26 ENCOUNTER — Emergency Department (HOSPITAL_COMMUNITY): Payer: No Typology Code available for payment source

## 2021-03-26 DIAGNOSIS — Z87891 Personal history of nicotine dependence: Secondary | ICD-10-CM | POA: Insufficient documentation

## 2021-03-26 DIAGNOSIS — Z3A01 Less than 8 weeks gestation of pregnancy: Secondary | ICD-10-CM

## 2021-03-26 DIAGNOSIS — N938 Other specified abnormal uterine and vaginal bleeding: Secondary | ICD-10-CM | POA: Diagnosis present

## 2021-03-26 DIAGNOSIS — O26851 Spotting complicating pregnancy, first trimester: Secondary | ICD-10-CM | POA: Diagnosis not present

## 2021-03-26 DIAGNOSIS — O26899 Other specified pregnancy related conditions, unspecified trimester: Secondary | ICD-10-CM

## 2021-03-26 DIAGNOSIS — N939 Abnormal uterine and vaginal bleeding, unspecified: Secondary | ICD-10-CM

## 2021-03-26 DIAGNOSIS — R102 Pelvic and perineal pain: Secondary | ICD-10-CM

## 2021-03-26 LAB — CBC WITH DIFFERENTIAL/PLATELET
Abs Immature Granulocytes: 0.04 10*3/uL (ref 0.00–0.07)
Basophils Absolute: 0 10*3/uL (ref 0.0–0.1)
Basophils Relative: 0 %
Eosinophils Absolute: 0.1 10*3/uL (ref 0.0–0.5)
Eosinophils Relative: 1 %
HCT: 37.1 % (ref 36.0–46.0)
Hemoglobin: 11.9 g/dL — ABNORMAL LOW (ref 12.0–15.0)
Immature Granulocytes: 1 %
Lymphocytes Relative: 18 %
Lymphs Abs: 1.6 10*3/uL (ref 0.7–4.0)
MCH: 27.6 pg (ref 26.0–34.0)
MCHC: 32.1 g/dL (ref 30.0–36.0)
MCV: 86.1 fL (ref 80.0–100.0)
Monocytes Absolute: 0.6 10*3/uL (ref 0.1–1.0)
Monocytes Relative: 7 %
Neutro Abs: 6.4 10*3/uL (ref 1.7–7.7)
Neutrophils Relative %: 73 %
Platelets: 299 10*3/uL (ref 150–400)
RBC: 4.31 MIL/uL (ref 3.87–5.11)
RDW: 13.2 % (ref 11.5–15.5)
WBC: 8.7 10*3/uL (ref 4.0–10.5)
nRBC: 0 % (ref 0.0–0.2)

## 2021-03-26 LAB — BASIC METABOLIC PANEL
Anion gap: 7 (ref 5–15)
BUN: 8 mg/dL (ref 6–20)
CO2: 26 mmol/L (ref 22–32)
Calcium: 9.4 mg/dL (ref 8.9–10.3)
Chloride: 106 mmol/L (ref 98–111)
Creatinine, Ser: 0.63 mg/dL (ref 0.44–1.00)
GFR, Estimated: >60 mL/min (ref >60)
Glucose, Bld: 125 mg/dL — ABNORMAL HIGH (ref 70–99)
Potassium: 3.9 mmol/L (ref 3.5–5.1)
Sodium: 139 mmol/L (ref 135–145)

## 2021-03-26 LAB — URINALYSIS, ROUTINE W REFLEX MICROSCOPIC
Bilirubin Urine: NEGATIVE
Glucose, UA: NEGATIVE mg/dL
Ketones, ur: NEGATIVE mg/dL
Leukocytes,Ua: NEGATIVE
Nitrite: NEGATIVE
Protein, ur: NEGATIVE mg/dL
RBC / HPF: >50 RBC/hpf — ABNORMAL HIGH (ref 0–5)
Specific Gravity, Urine: 1.006 (ref 1.005–1.030)
pH: 6 (ref 5.0–8.0)

## 2021-03-26 LAB — PREGNANCY, URINE: Preg Test, Ur: POSITIVE — AB

## 2021-03-26 LAB — HCG, QUANTITATIVE, PREGNANCY: hCG, Beta Chain, Quant, S: 1877 m[IU]/mL — ABNORMAL HIGH (ref <5)

## 2021-03-26 LAB — ABO/RH: ABO/RH(D): O POS

## 2021-03-26 LAB — PROTIME-INR
INR: 1 (ref 0.8–1.2)
Prothrombin Time: 13 seconds (ref 11.4–15.2)

## 2021-03-26 NOTE — ED Triage Notes (Signed)
Patient c/o large amount of vaginal bleeding with cramps and clots. Per patient bleeding started on Monday. Patient took pregnancy test today, positive. Per patient taking fertility medication. Last period was 02/27/2001 and slight spotting since. Patient taking ibuprofen and Midol with no relief. Patient having to change super tampon every hour.

## 2021-03-26 NOTE — ED Provider Notes (Signed)
[facility] EMERGENCY DEPARTMENT Provider Note   CSN: [id number] Arrival date & time: 03/26/21  5625     History No chief complaint on file.   Katherine [name] is a 30 y.o. female.  HPI  Patient with history of secondary oligomenorrhea presents due to vaginal bleeding.  Patient is trying to get pregnant, took a fertility medicine in middle of October.  Reports she has been having vaginal bleeding since 02/27/2021.  In the last few days she has passed blood clots, there is also associated cramping which is new.  The cramping is located to the lower quadrants bilaterally, is constant.  She took an at home pregnancy test yesterday which was positive.  No history of clotting disorders, denies any severe abdominal pain.  No back pain, no syncopal events.  No known history of anemia, not feeling lightheaded or dizzy.  Past Medical History:  Diagnosis Date  . Bacterial vaginosis   . Secondary oligomenorrhea     Patient Active Problem List   Diagnosis Date Noted  . Excess body and facial hair 08/01/2019  . Irregular periods 08/01/2019  . Missed periods 08/01/2019  . Pregnancy examination or test, negative result 08/01/2019  . Patient desires pregnancy 08/01/2019  . Nexplanon insertion 08/29/2017  . Marijuana use 12/11/2016    Past Surgical History:  Procedure Laterality Date  . TONSILLECTOMY       OB History     Gravida  1   Para  1   Term  1   Preterm      AB      Living  1      SAB      IAB      Ectopic      Multiple  0   Live Births  1           Family History  Problem Relation Age of Onset  . Diabetes Mother   . Cancer Mother   . Vision loss Father   . Asthma Brother   . ADD / ADHD Brother   . Drug abuse Brother   . Seizures Maternal Uncle   . Heart disease Paternal Aunt   . Heart attack Maternal Grandmother   . Alcohol abuse Paternal Grandfather   . Mental retardation Cousin     Social History   Tobacco Use  . Smoking status: Former     Types: Cigars, E-cigarettes  . Smokeless tobacco: Never  . Tobacco comments:    Vaped for 1 year in 2021 to 08/2020  Vaping Use  . Vaping Use: Every day  . Substances: Nicotine, Flavoring  Substance Use Topics  . Alcohol use: Yes    Comment: weekly  . Drug use: Not Currently    Types: Marijuana    Comment: last used 11/2016    Home Medications Prior to Admission medications   Medication Sig Start Date End Date Taking? Authorizing Provider  acetaminophen (TYLENOL) 500 MG tablet Take 500 mg by mouth as needed for moderate pain.  Patient not taking: Reported on 06/04/2020    [provider]  Calcium Carbonate Antacid (TUMS PO) Take 1 tablet by mouth daily as needed (heartburn).     [provider]  clomiPHENE (CLOMID) 50 MG tablet Take 3 tablets on days 3-7 of cycle Patient not taking: No sig reported 01/26/20   Cyril Mourning A, NP  letrozole [facility]) 2.5 MG tablet Take 1 tablet (2.5 mg total) by mouth daily. Days 3-7 of the cycle 02/07/21  Lazaro Arms, MD  Loratadine (CLARITIN PO) Take by mouth.    [provider]  medroxyPROGESTERone (PROVERA) 10 MG tablet Take 1 tablet (10 mg total) by mouth daily. Patient not taking: Reported on 06/04/2020 03/12/20   Lazaro Arms, MD  metFORMIN (GLUCOPHAGE) 500 MG tablet Take 1 tablet (500 mg total) by mouth daily with breakfast. 02/24/20 02/23/21  Adline Potter, NP  prenatal vitamin w/FE, FA (PRENATAL 1 + 1) 27-1 MG TABS tablet Take 1 tablet by mouth daily at 12 noon. Patient taking differently: Take 1 tablet by mouth. 08/01/19   Adline Potter, NP  VITAMIN D PO Take by mouth. Patient not taking: Reported on 06/04/2020    [provider]    Allergies    Sulfa antibiotics  Review of Systems   Review of Systems  Constitutional:  Negative for fever.  Respiratory:  Negative for shortness of breath.   Cardiovascular:  Negative for chest pain.  Gastrointestinal:  Positive for abdominal pain.  Negative for diarrhea, nausea and vomiting.  Genitourinary:  Positive for menstrual problem and vaginal bleeding. Negative for vaginal pain.  Neurological:  Negative for syncope and weakness.  Hematological:  Negative for adenopathy. Does not bruise/bleed easily.   Physical Exam Updated Vital Signs BP 112/70 (BP Location: Left Arm)   Pulse 78   Temp 98.2 F (36.8 C) (Oral)   Resp 18   Ht 5\' 3"  (1.6 m)   Wt 83.9 kg   LMP 02/27/2021   SpO2 100%   BMI 32.77 kg/m   Physical Exam Vitals and nursing note reviewed. Exam conducted with a chaperone present.  Constitutional:      Appearance: Normal appearance.  HENT:     Head: Normocephalic and atraumatic.  Eyes:     General: No scleral icterus.       Right eye: No discharge.        Left eye: No discharge.     Extraocular Movements: Extraocular movements intact.     Pupils: Pupils are equal, round, and reactive to light.  Cardiovascular:     Rate and Rhythm: Normal rate and regular rhythm.     Pulses: Normal pulses.     Heart sounds: Normal heart sounds. No murmur heard.   No friction rub. No gallop.  Pulmonary:     Effort: Pulmonary effort is normal. No respiratory distress.     Breath sounds: Normal breath sounds.  Abdominal:     General: Abdomen is flat. Bowel sounds are normal. There is no distension.     Palpations: Abdomen is soft.     Tenderness: There is no abdominal tenderness.     Comments: Abdomen is soft, not particularly tender.  Skin:    General: Skin is warm and dry.     Coloration: Skin is not jaundiced.  Neurological:     Mental Status: She is alert. Mental status is at baseline.     Coordination: Coordination normal.   ED Results / Procedures / Treatments   Labs (all labs ordered are listed, but only abnormal results are displayed) Labs Reviewed  HCG, QUANTITATIVE, PREGNANCY - Abnormal; Notable for the following components:      Result Value   hCG, Beta Chain, Quant, S 1,877 (*)    All other  components within normal limits  CBC WITH DIFFERENTIAL/PLATELET - Abnormal; Notable for the following components:   Hemoglobin 11.9 (*)    All other components within normal limits  BASIC METABOLIC PANEL - Abnormal; Notable for  the following components:   Glucose, Bld 125 (*)    All other components within normal limits  URINALYSIS, ROUTINE W REFLEX MICROSCOPIC - Abnormal; Notable for the following components:   Hgb urine dipstick LARGE (*)    RBC / HPF >50 (*)    Bacteria, UA RARE (*)    All other components within normal limits  PREGNANCY, URINE - Abnormal; Notable for the following components:   Preg Test, Ur POSITIVE (*)    All other components within normal limits  PROTIME-INR  ABO/RH    EKG None  Radiology US OB LESS THAN 14 WEEKS WITH OB TRANSVAGINAL  Result Date: 03/26/2021 CLINICAL DATA:  Cramping, bleeding.  Beta HCG of 1,877 EXAM: OBSTETRIC <14 WK [place] AND TRANSVAGINAL OB US TECHNIQUE: Both transabdominal and transvaginal ultrasound examinations were performed for complete evaluation of the gestation as well as the maternal uterus, adnexal regions, and pelvic cul-de-sac. Transvaginal technique was performed to assess early pregnancy. COMPARISON:  None. FINDINGS: Intrauterine gestational sac: None Yolk sac:  Not Visualized. Embryo:  Not Visualized. Cardiac Activity: Not Visualized. Maternal uterus/adnexae: There is a irregular complex, heterogeneous area with fluid and debris in the endometrial canal in the region of the lower uterine segment. No definitive gestational sac or embryo. Bilateral ovaries within normal limits. No adnexal masses identified. Trace free fluid within the cul-de-sac. IMPRESSION: Complex-appearing collection with fluid and debris in the endometrial canal in the region of the lower uterine segment, which are nonspecific but could reflect products of a failed first trimester pregnancy. No definite intrauterine pregnancy. No findings to suggest ectopic  pregnancy. Recommend trending of beta HCG as well as follow-up ultrasound in 7-10 days based on clinical course. Electronically Signed   By: Duanne Guess D.O.   On: 03/26/2021 13:31    Procedures Procedures   Medications Ordered in ED Medications - No data to display  ED Course  I have reviewed the triage vital signs and the nursing notes.  Pertinent labs & imaging results that were available during my care of the patient were reviewed by me and considered in my medical decision making (see chart for details).  Clinical Course as of 03/26/21 1554  Sat Mar 26, 2021  1431 US OB LESS THAN 14 WEEKS WITH OB TRANSVAGINAL [HS]    Clinical Course User Index [HS] Theron Arista, [place]   MDM Rules/Calculators/A&P                           Patient presents with vaginal bleeding and suspected miscarriage.  Her vitals are stable, no tachycardia or hypotension concerning for hemodynamic instability.  Hemoglobin stable, 11.9 compared to a baseline of 12.  This is reassuring given she has been bleeding for a month consistently.  Ultrasound was ordered to evaluate for ectopic versus miscarriage, the result is not consistent with ectopic but concerning for possible unviable pregnancy.  Discussed the patient case with the on-call OB gynecologist, they advised discharge with hemoglobin trending and outpatient ultrasound in a week.  Discussed the results with the patient as well as the possibility of nonviable pregnancy.  Patient verbalized understanding and agreement to follow-up with her OBG next week.  She plans on calling Monday morning.  Patient discharged in stable condition.  Final Clinical Impression(s) / ED Diagnoses Final diagnoses:  Vaginal bleeding    Rx / DC Orders ED Discharge Orders     None        Theron Arista, [place]  03/26/21 1912    Terrilee Files, MD 03/27/21 [phone]

## 2021-03-26 NOTE — ED Provider Notes (Signed)
Emergency Medicine Provider Triage Evaluation Note  Katherine [name] , a 30 y.o. female  was evaluated in triage.  Pt complains of vaginal bleeding.  This is been going on for almost a month, started passing clots on Monday and had cramping abdominal pain that is been constant.  Been trying to get pregnant, took fertility medicine roughly a month ago as well.  Had a positive urine pregnancy earlier today..  Review of Systems  Positive: Vaginal bleeding, abdominal cramping Negative: Nausea, vomiting  Physical Exam  BP 128/80 (BP Location: Right Arm)   Pulse (!) 107   Temp 98.2 F (36.8 C) (Oral)   Resp 18   Ht 5\' 3"  (1.6 m)   Wt 83.9 kg   LMP 02/27/2021   SpO2 100%   BMI 32.77 kg/m  Gen:   Awake, no distress   Resp:  Normal effort  MSK:   Moves extremities without difficulty  Other:  Abdomen is soft, not particularly tender  Medical Decision Making  Medically screening exam initiated at 10:47 AM.  Appropriate orders placed.  Katherine [name] was informed that the remainder of the evaluation will be completed by another provider, this initial triage assessment does not replace that evaluation, and the importance of remaining in the ED until their evaluation is complete.    Trenia, [place], PA-C 03/26/21 1048    03/28/21, MD 03/26/21 1900

## 2021-03-26 NOTE — Progress Notes (Signed)
   OB/GYN Telephone Consult  03/26/2021   Katherine [name] is a 30 y.o. G1P1001 currently at Unknown gestations presenting to Ed for cramping and bleeding. I was called for a consult regarding the care of this patient by [facility]                                                          .    The provider had a clinical question about follow-up  The provider presented the following relevant clinical information: HCG of 1877 Blood type is O pos Trying to conceive Bleeding, cramping, bilateral hip pain  I performed a chart review on the patient and reviewed available documentation.  BP 128/80 (BP Location: Right Arm)   Pulse (!) 107   Temp 98.2 F (36.8 C) (Oral)   Resp 18   Ht 5\' 3"  (1.6 m)   Wt 83.9 kg   LMP 02/27/2021   SpO2 100%   BMI 32.77 kg/m   Exam- performed by consulting provider Independently reviewed u/s images  Recommendations:  -presentation is most c/w SAB in progress though ectopic is not completely r/o. Given appearance of debris in uterus, suspect SAB -Bleeding and return precautions. Please come to [facility] if worsening symptoms -Office f/u at [facility] this week with repeat HCG non-stat to trend down -Recommended MD/APP provide the patient with a referral to the [facility] (any office) for follow up in  2 weeks.   Thank you for this consult and if additional recommendations are needed please call [phone] for the OB/GYN attending on service at [facility].   I spent approximately 10 minutes directly consulting with the provider and verbally discussing this case. Additionally 10 minutes minutes was spent performing chart review and documentation.    [facility], MD

## 2021-03-26 NOTE — Discharge Instructions (Addendum)
Follow up with OBGYN in one week. Take Tylenol as needed for pain.

## 2021-03-28 ENCOUNTER — Other Ambulatory Visit: Payer: No Typology Code available for payment source

## 2021-03-28 ENCOUNTER — Other Ambulatory Visit: Payer: Self-pay

## 2021-03-28 ENCOUNTER — Telehealth: Payer: Self-pay | Admitting: Obstetrics & Gynecology

## 2021-03-28 DIAGNOSIS — O4690 Antepartum hemorrhage, unspecified, unspecified trimester: Secondary | ICD-10-CM

## 2021-03-28 NOTE — Telephone Encounter (Signed)
Patient called stating that she went earlier today to get her HCG levels check and she would like to know if the results has come in, so she could know what to do next. Please contact pt

## 2021-03-28 NOTE — Telephone Encounter (Signed)
Patient informed blood work results will not be back until tomorrow.  Advised to give our office a call if she does not hear from [place] by lunch.  Pt verbalized understanding and agreeable to plan.

## 2021-03-29 LAB — BETA HCG QUANT (REF LAB): hCG Quant: 584 m[IU]/mL

## 2021-05-11 ENCOUNTER — Encounter: Payer: Self-pay | Admitting: Obstetrics & Gynecology

## 2021-05-12 MED ORDER — MEDROXYPROGESTERONE ACETATE 10 MG PO TABS: 10.0000 mg | ORAL_TABLET | Freq: Every day | ORAL | 0 refills | Status: DC

## 2021-06-03 ENCOUNTER — Encounter: Payer: Self-pay | Admitting: Obstetrics & Gynecology

## 2021-06-03 ENCOUNTER — Other Ambulatory Visit: Payer: Self-pay | Admitting: Adult Health

## 2021-06-03 MED ORDER — PRENATAL PLUS 27-1 MG PO TABS: 1.0000 | ORAL_TABLET | Freq: Every day | ORAL | 12 refills | Status: DC

## 2021-06-03 NOTE — Progress Notes (Signed)
Rx PNV ?

## 2021-09-07 ENCOUNTER — Other Ambulatory Visit: Payer: Self-pay | Admitting: Obstetrics & Gynecology

## 2021-09-08 ENCOUNTER — Other Ambulatory Visit: Payer: Self-pay | Admitting: Obstetrics & Gynecology

## 2021-09-08 MED ORDER — MEDROXYPROGESTERONE ACETATE 10 MG PO TABS: 10.0000 mg | ORAL_TABLET | Freq: Every day | ORAL | 11 refills | Status: DC

## 2022-02-21 ENCOUNTER — Other Ambulatory Visit: Payer: Medicaid Other

## 2022-02-21 DIAGNOSIS — Z789 Other specified health status: Secondary | ICD-10-CM

## 2022-02-21 DIAGNOSIS — Z3201 Encounter for pregnancy test, result positive: Secondary | ICD-10-CM

## 2022-02-22 LAB — BETA HCG QUANT (REF LAB): hCG Quant: 32877 m[IU]/mL

## 2022-03-08 ENCOUNTER — Ambulatory Visit (INDEPENDENT_AMBULATORY_CARE_PROVIDER_SITE_OTHER): Payer: Medicaid Other

## 2022-03-08 ENCOUNTER — Other Ambulatory Visit: Payer: Self-pay | Admitting: Obstetrics & Gynecology

## 2022-03-08 DIAGNOSIS — Z3A14 14 weeks gestation of pregnancy: Secondary | ICD-10-CM

## 2022-03-08 DIAGNOSIS — O3680X0 Pregnancy with inconclusive fetal viability, not applicable or unspecified: Secondary | ICD-10-CM

## 2022-03-08 NOTE — Progress Notes (Signed)
[place] 8+2 wks,single IUP with yolk sac,CRL 17.56 mm,FHR 176 bpm,bilaterally enlarged ovaries with multiple peripheral follicles,? PCOS

## 2022-03-10 ENCOUNTER — Ambulatory Visit: Payer: Medicaid Other | Admitting: *Deleted

## 2022-03-10 ENCOUNTER — Encounter: Payer: Medicaid Other | Admitting: Advanced Practice Midwife

## 2022-03-15 ENCOUNTER — Encounter: Payer: Self-pay | Admitting: Advanced Practice Midwife

## 2022-03-24 ENCOUNTER — Other Ambulatory Visit: Payer: Medicaid Other

## 2022-04-05 ENCOUNTER — Other Ambulatory Visit: Payer: Self-pay | Admitting: Obstetrics & Gynecology

## 2022-04-05 DIAGNOSIS — Z3682 Encounter for antenatal screening for nuchal translucency: Secondary | ICD-10-CM

## 2022-04-06 ENCOUNTER — Ambulatory Visit: Payer: Medicaid Other | Admitting: *Deleted

## 2022-04-06 ENCOUNTER — Other Ambulatory Visit: Payer: Medicaid Other

## 2022-04-06 ENCOUNTER — Encounter: Payer: Medicaid Other | Admitting: Advanced Practice Midwife

## 2022-04-06 ENCOUNTER — Ambulatory Visit (INDEPENDENT_AMBULATORY_CARE_PROVIDER_SITE_OTHER): Payer: Medicaid Other | Admitting: Advanced Practice Midwife

## 2022-04-06 ENCOUNTER — Encounter: Payer: Self-pay | Admitting: Advanced Practice Midwife

## 2022-04-06 ENCOUNTER — Ambulatory Visit (INDEPENDENT_AMBULATORY_CARE_PROVIDER_SITE_OTHER): Payer: Medicaid Other

## 2022-04-06 VITALS — BP 106/58 | HR 79 | Wt 181.0 lb

## 2022-04-06 DIAGNOSIS — Z3490 Encounter for supervision of normal pregnancy, unspecified, unspecified trimester: Secondary | ICD-10-CM | POA: Insufficient documentation

## 2022-04-06 DIAGNOSIS — Z131 Encounter for screening for diabetes mellitus: Secondary | ICD-10-CM

## 2022-04-06 DIAGNOSIS — Z3480 Encounter for supervision of other normal pregnancy, unspecified trimester: Secondary | ICD-10-CM

## 2022-04-06 DIAGNOSIS — Z6831 Body mass index (BMI) 31.0-31.9, adult: Secondary | ICD-10-CM

## 2022-04-06 DIAGNOSIS — N914 Secondary oligomenorrhea: Secondary | ICD-10-CM | POA: Insufficient documentation

## 2022-04-06 DIAGNOSIS — O0993 Supervision of high risk pregnancy, unspecified, third trimester: Secondary | ICD-10-CM | POA: Insufficient documentation

## 2022-04-06 DIAGNOSIS — Z3682 Encounter for antenatal screening for nuchal translucency: Secondary | ICD-10-CM

## 2022-04-06 DIAGNOSIS — Z3A12 12 weeks gestation of pregnancy: Secondary | ICD-10-CM

## 2022-04-06 NOTE — Progress Notes (Signed)
[place] 12+3 wks,measurements c/w dates,FHR 169 bpm,anterior placenta,normal ovaries,NT 1.2 mm,NB present,CRL 57.28 mm

## 2022-04-06 NOTE — Progress Notes (Signed)
INITIAL OBSTETRICAL VISIT Patient name: Katherine [name] MRN [id number]  Date of birth: 1991-02-11 Chief Complaint:   Initial Prenatal Visit  History of Present Illness:   Katherine [name] is a 31 y.o. G57P1001 Caucasian female at [redacted]w[redacted]d by 8 weeks [place] with an Estimated Date of Delivery: 10/16/22 being seen today for her initial obstetrical visit.   Her obstetrical history is significant for  early SAB last year, 9# 5oz without any problems/lacerations .   Today she reports fatigue.     04/06/2022    3:17 PM 06/04/2020   11:16 AM 08/01/2019   10:03 AM 12/11/2016    2:38 PM  Depression screen PHQ 2/9  Decreased Interest 0 0 0 1  Down, Depressed, Hopeless 0 0 0 0  PHQ - 2 Score 0 0 0 1  Altered sleeping 1 0  0  Tired, decreased energy 1 1  2   Change in appetite 0 0  1  Feeling bad or failure about yourself  0 0  0  Trouble concentrating 0 0  0  Moving slowly or fidgety/restless 0   0  Suicidal thoughts 0   0  PHQ-9 Score 2 1  4   Difficult doing work/chores  Not difficult at all      Patient's last menstrual period was 11/29/2021. Last pap 06/04/20. Results were:  ASCUS/neg HPV.  Recommended repeat HPV/pap in 1 year.  Will do next visit Review of Systems:   Pertinent items are noted in HPI Denies cramping/contractions, leakage of fluid, vaginal bleeding, abnormal vaginal discharge w/ itching/odor/irritation, headaches, visual changes, shortness of breath, chest pain, abdominal pain, severe nausea/vomiting, or problems with urination or bowel movements unless otherwise stated above.  Pertinent History Reviewed:  Reviewed past medical,surgical, social, obstetrical and family history.  Reviewed problem list, medications and allergies. OB History  Gravida Para Term Preterm AB Living  2 1 1     1   SAB IAB Ectopic Multiple Live Births        0 1    # Outcome Date GA Lbr Len/2nd Weight Sex Delivery Anes PTL Lv  2 Current           1 Term 07/18/17 [redacted]w[redacted]d 18:01 / 02:19 9 lb 5 oz (4.225 kg) M  Vag-Spont EPI N LIV     Birth Comments: Molding noted on baby's head   Physical Assessment:   Vitals:   04/06/22 1525  BP: (!) 106/58  Pulse: 79  Weight: 181 lb (82.1 kg)  Body mass index is 32.06 kg/m.       Physical Examination:  General appearance - well appearing, and in no distress  Mental status - alert, oriented to person, place, and time  Psych:  She has a normal mood and affect  Skin - warm and dry, normal color, no suspicious lesions noted  Chest - effort normal  Heart - normal rate and regular rhythm  Abdomen - soft, nontender  Extremities:  No swelling or varicosities noted    TODAY'S NT 07/20/17 12+3 wks,measurements c/w dates,FHR 169 bpm,anterior placenta,normal ovaries,NT 1.2 mm,NB present,CRL 57.28 mm   No results found for this or any previous visit (from the past 24 hour(s)).  Assessment & Plan:  1) Low-Risk Pregnancy G2P1001 at [redacted]w[redacted]d with an Estimated Date of Delivery: 10/16/22   2) Initial OB visit  3) PCOS:  stopped MTF w/early pg  Meds: No orders of the defined types were placed in this encounter.   Initial labs obtained Continue prenatal vitamins  Reviewed n/v relief measures and warning s/s to report Reviewed recommended weight gain based on pre-gravid BMI Encouraged well-balanced diet Genetic & carrier screening discussed: requests Panorama, NT/IT, and Horizon , declines AFP Ultrasound discussed; fetal survey: requested CCNC completed> form faxed if has or is planning to apply for medicaid The nature of [facility] [employer] with multiple MDs and other Advanced Practice Providers was explained to patient; also emphasized that fellows, residents, and students are part of our team. Has home bp cuff. Check bp weekly, let us know if >140/90.        Katherine [name] 4:28 PM

## 2022-04-06 NOTE — Patient Instructions (Signed)
Katherine [name], I greatly value your feedback.  If you receive a survey following your visit with [place] today, we appreciate you taking the time to fill it out.  Thanks, [name], DNP, CNM  [facility] HAS MOVED!!! It is now [facility] at [facility] ([address] [place], [place] [address]) Entrance located off of [address] Free 24/7 valet parking   Nausea & Vomiting Have saltine crackers or pretzels by your bed and eat a few bites before you raise your head out of bed in the morning Eat small frequent meals throughout the day instead of large meals Drink plenty of fluids throughout the day to stay hydrated, just don't drink a lot of fluids with your meals.  This can make your stomach fill up faster making you feel sick Do not brush your teeth right after you eat Products with real ginger are good for nausea, like ginger ale and ginger hard candy Make sure it says made with real ginger! Sucking on sour candy like lemon heads is also good for nausea If your prenatal vitamins make you nauseated, take them at night so you will sleep through the nausea Sea Bands If you feel like you need medicine for the nausea & vomiting please let us know If you are unable to keep any fluids or food down please let us know   Constipation Drink plenty of fluid, preferably water, throughout the day Eat foods high in fiber such as fruits, vegetables, and grains Exercise, such as walking, is a good way to keep your bowels regular Drink warm fluids, especially warm prune juice, or decaf coffee Eat a 1/2 cup of real oatmeal (not instant), 1/2 cup applesauce, and 1/2-1 cup warm prune juice every day If needed, you may take Colace (docusate sodium) stool softener once or twice a day to help keep the stool soft.  If you still are having problems with constipation, you may take Miralax once daily as needed to help keep your bowels regular.   Home Blood Pressure Monitoring for  Patients   Your provider has recommended that you check your blood pressure (BP) at least once a week at home. If you do not have a blood pressure cuff at home, one will be provided for you. Contact your provider if you have not received your monitor within 1 week.   Helpful Tips for Accurate Home Blood Pressure Checks  Don't smoke, exercise, or drink caffeine 30 minutes before checking your BP Use the restroom before checking your BP (a full bladder can raise your pressure) Relax in a comfortable upright chair Feet on the ground Left arm resting comfortably on a flat surface at the level of your heart Legs uncrossed Back supported Sit quietly and don't talk Place the cuff on your bare arm Adjust snuggly, so that only two fingertips can fit between your skin and the top of the cuff Check 2 readings separated by at least one minute Keep a log of your BP readings For a visual, please reference this diagram: http://ccnc.care/bpdiagram  Provider Name: [facility]     Phone: [phone]  Zone 1: ALL CLEAR  Continue to monitor your symptoms:  BP reading is less than 140 (top number) or less than 90 (bottom number)  No right upper stomach pain No headaches or seeing spots No feeling nauseated or throwing up No swelling in face and hands  Zone 2: CAUTION Call your doctor's office for any of the following:  BP reading is greater than 140 (top number) or greater than 90 (bottom number)  Stomach pain under your ribs in the middle or right side Headaches or seeing spots Feeling nauseated or throwing up Swelling in face and hands  Zone 3: EMERGENCY  Seek immediate medical care if you have any of the following:  BP reading is greater than160 (top number) or greater than 110 (bottom number) Severe headaches not improving with Tylenol Serious difficulty catching your breath Any worsening symptoms from Zone 2    First Trimester of Pregnancy The first trimester of pregnancy is from  week 1 until the end of week 12 (months 1 through 3). A week after a sperm fertilizes an egg, the egg will implant on the wall of the uterus. This embryo will begin to develop into a baby. Genes from you and your partner are forming the baby. The female genes determine whether the baby is a boy or a girl. At 6-8 weeks, the eyes and face are formed, and the heartbeat can be seen on ultrasound. At the end of 12 weeks, all the baby's organs are formed.  Now that you are pregnant, you will want to do everything you can to have a healthy baby. Two of the most important things are to get good prenatal care and to follow your health care provider's instructions. Prenatal care is all the medical care you receive before the baby's birth. This care will help prevent, find, and treat any problems during the pregnancy and childbirth. BODY CHANGES Your body goes through many changes during pregnancy. The changes vary from woman to woman.  You may gain or lose a couple of pounds at first. You may feel sick to your stomach (nauseous) and throw up (vomit). If the vomiting is uncontrollable, call your health care provider. You may tire easily. You may develop headaches that can be relieved by medicines approved by your health care provider. You may urinate more often. Painful urination may mean you have a bladder infection. You may develop heartburn as a result of your pregnancy. You may develop constipation because certain hormones are causing the muscles that push waste through your intestines to slow down. You may develop hemorrhoids or swollen, bulging veins (varicose veins). Your breasts may begin to grow larger and become tender. Your nipples may stick out more, and the tissue that surrounds them (areola) may become darker. Your gums may bleed and may be sensitive to brushing and flossing. Dark spots or blotches (chloasma, mask of pregnancy) may develop on your face. This will likely fade after the baby is  born. Your menstrual periods will stop. You may have a loss of appetite. You may develop cravings for certain kinds of food. You may have changes in your emotions from day to day, such as being excited to be pregnant or being concerned that something may go wrong with the pregnancy and baby. You may have more vivid and strange dreams. You may have changes in your hair. These can include thickening of your hair, rapid growth, and changes in texture. Some women also have hair loss during or after pregnancy, or hair that feels dry or thin. Your hair will most likely return to normal after your baby is born. WHAT TO EXPECT AT YOUR PRENATAL VISITS During a routine prenatal visit: You will be weighed to make sure you and the baby are growing normally. Your blood pressure will be taken. Your abdomen will be measured to track your baby's growth. The fetal  heartbeat will be listened to starting around week 10 or 12 of your pregnancy. Test results from any previous visits will be discussed. Your health care provider may ask you: How you are feeling. If you are feeling the baby move. If you have had any abnormal symptoms, such as leaking fluid, bleeding, severe headaches, or abdominal cramping. If you have any questions. Other tests that may be performed during your first trimester include: Blood tests to find your blood type and to check for the presence of any previous infections. They will also be used to check for low iron levels (anemia) and Rh antibodies. Later in the pregnancy, blood tests for diabetes will be done along with other tests if problems develop. Urine tests to check for infections, diabetes, or protein in the urine. An ultrasound to confirm the proper growth and development of the baby. An amniocentesis to check for possible genetic problems. Fetal screens for spina bifida and Down syndrome. You may need other tests to make sure you and the baby are doing well. HOME CARE  INSTRUCTIONS  Medicines Follow your health care provider's instructions regarding medicine use. Specific medicines may be either safe or unsafe to take during pregnancy. Take your prenatal vitamins as directed. If you develop constipation, try taking a stool softener if your health care provider approves. Diet Eat regular, well-balanced meals. Choose a variety of foods, such as meat or vegetable-based protein, fish, milk and low-fat dairy products, vegetables, fruits, and whole grain breads and cereals. Your health care provider will help you determine the amount of weight gain that is right for you. Avoid raw meat and uncooked cheese. These carry germs that can cause birth defects in the baby. Eating four or five small meals rather than three large meals a day may help relieve nausea and vomiting. If you start to feel nauseous, eating a few soda crackers can be helpful. Drinking liquids between meals instead of during meals also seems to help nausea and vomiting. If you develop constipation, eat more high-fiber foods, such as fresh vegetables or fruit and whole grains. Drink enough fluids to keep your urine clear or pale yellow. Activity and Exercise Exercise only as directed by your health care provider. Exercising will help you: Control your weight. Stay in shape. Be prepared for labor and delivery. Experiencing pain or cramping in the lower abdomen or low back is a good sign that you should stop exercising. Check with your health care provider before continuing normal exercises. Try to avoid standing for long periods of time. Move your legs often if you must stand in one place for a long time. Avoid heavy lifting. Wear low-heeled shoes, and practice good posture. You may continue to have sex unless your health care provider directs you otherwise. Relief of Pain or Discomfort Wear a good support bra for breast tenderness.   Take warm sitz baths to soothe any pain or discomfort caused by  hemorrhoids. Use hemorrhoid cream if your health care provider approves.   Rest with your legs elevated if you have leg cramps or low back pain. If you develop varicose veins in your legs, wear support hose. Elevate your feet for 15 minutes, 3-4 times a day. Limit salt in your diet. Prenatal Care Schedule your prenatal visits by the twelfth week of pregnancy. They are usually scheduled monthly at first, then more often in the last 2 months before delivery. Write down your questions. Take them to your prenatal visits. Keep all your prenatal visits as directed  by your health care provider. Safety Wear your seat belt at all times when driving. Make a list of emergency phone numbers, including numbers for family, friends, the hospital, and police and fire departments. General Tips Ask your health care provider for a referral to a local prenatal education class. Begin classes no later than at the beginning of month 6 of your pregnancy. Ask for help if you have counseling or nutritional needs during pregnancy. Your health care provider can offer advice or refer you to specialists for help with various needs. Do not use hot tubs, steam rooms, or saunas. Do not douche or use tampons or scented sanitary pads. Do not cross your legs for long periods of time. Avoid cat litter boxes and soil used by cats. These carry germs that can cause birth defects in the baby and possibly loss of the fetus by miscarriage or stillbirth. Avoid all smoking, herbs, alcohol, and medicines not prescribed by your health care provider. Chemicals in these affect the formation and growth of the baby. Schedule a dentist appointment. At home, brush your teeth with a soft toothbrush and be gentle when you floss. SEEK MEDICAL CARE IF:  You have dizziness. You have mild pelvic cramps, pelvic pressure, or nagging pain in the abdominal area. You have persistent nausea, vomiting, or diarrhea. You have a bad smelling vaginal  discharge. You have pain with urination. You notice increased swelling in your face, hands, legs, or ankles. SEEK IMMEDIATE MEDICAL CARE IF:  You have a fever. You are leaking fluid from your vagina. You have spotting or bleeding from your vagina. You have severe abdominal cramping or pain. You have rapid weight gain or loss. You vomit blood or material that looks like coffee grounds. You are exposed to [place] measles and have never had them. You are exposed to fifth disease or chickenpox. You develop a severe headache. You have shortness of breath. You have any kind of trauma, such as from a fall or a car accident. Document Released: 04/18/2001 Document Revised: 09/08/2013 Document Reviewed: 03/04/2013 [facility] Patient Information 2015 [place], [place]. This information is not intended to replace advice given to you by your health care provider. Make sure you discuss any questions you have with your health care provider.  Coronavirus (COVID-19) Are you at risk?  Are you at risk for the Coronavirus (COVID-19)?  To be considered HIGH RISK for Coronavirus (COVID-19), you have to meet the following criteria:  Traveled to [place], [place], [place], [place] or [place];  and have fever, cough, and shortness of breath within the last 2 weeks of travel OR Been in close contact with a person diagnosed with COVID-19 within the last 2 weeks and have fever, cough, and shortness of breath IF YOU DO NOT MEET THESE CRITERIA, YOU ARE CONSIDERED LOW RISK FOR COVID-19.  What to do if you are HIGH RISK for COVID-19?  If you are having a medical emergency, call 911. Seek medical care right away. Before you go to a doctor's office, urgent care or emergency department, call ahead and tell them about your recent travel, contact with someone diagnosed with COVID-19, and your symptoms. You should receive instructions from your physician's office regarding next steps of care.  When you arrive at healthcare provider,  tell the healthcare staff immediately you have returned from visiting [place], [place], [place], [place] or [place]; in the last two weeks or you have been in close contact with a person diagnosed with COVID-19 in the last 2 weeks.  Tell the health care staff about your symptoms: fever, cough and shortness of breath. After you have been seen by a medical provider, you will be either: Tested for (COVID-19) and discharged home on quarantine except to seek medical care if symptoms worsen, and asked to  Stay home and avoid contact with others until you get your results (4-5 days)  Avoid travel on public transportation if possible (such as bus, train, or airplane) or Sent to the Emergency Department by EMS for evaluation, COVID-19 testing, and possible admission depending on your condition and test results.  What to do if you are LOW RISK for COVID-19?  Reduce your risk of any infection by using the same precautions used for avoiding the common cold or flu:  Wash your hands often with soap and warm water for at least 20 seconds.  If soap and water are not readily available, use an alcohol-based hand sanitizer with at least 60% alcohol.  If coughing or sneezing, cover your mouth and nose by coughing or sneezing into the elbow areas of your shirt or coat, into a tissue or into your sleeve (not your hands). Avoid shaking hands with others and consider head nods or verbal greetings only. Avoid touching your eyes, nose, or mouth with unwashed hands.  Avoid close contact with people who are sick. Avoid places or events with large numbers of people in one location, like concerts or sporting events. Carefully consider travel plans you have or are making. If you are planning any travel outside or inside the [place], visit the CDC's Travelers' Health webpage for the latest health notices. If you have some symptoms but not all symptoms, continue to monitor at home and seek medical attention if your symptoms worsen. If  you are having a medical emergency, call 911.   ADDITIONAL HEALTHCARE OPTIONS FOR [place] / e-Visit: [email or web address]         [facility]: [facility]: [id number]                   [facility]: [phone]     Safe Medications in Pregnancy   Acne: Benzoyl Peroxide Salicylic Acid  Backache/Headache: Tylenol: 2 regular strength every 4 hours OR              2 Extra strength every 6 hours  Colds/Coughs/Allergies: Benadryl (alcohol free) 25 mg every 6 hours as needed Breath right strips Claritin Cepacol throat lozenges Chloraseptic throat spray Cold-Eeze- up to three times per day Cough drops, alcohol free Flonase (by prescription only) Guaifenesin Mucinex Robitussin DM (plain only, alcohol free) Saline nasal spray/drops Sudafed (pseudoephedrine) & Actifed ** use only after [redacted] weeks gestation and if you do not have high blood pressure Tylenol Vicks Vaporub Zinc lozenges Zyrtec   Constipation: Colace Ducolax suppositories Fleet enema Glycerin suppositories Metamucil Milk of magnesia Miralax Senokot Smooth move tea  Diarrhea: Kaopectate Imodium A-D  *NO pepto Bismol  Hemorrhoids: Anusol Anusol HC Preparation H Tucks  Indigestion: Tums Maalox Mylanta Zantac  Pepcid  Insomnia: Benadryl (alcohol free) 36m every 6 hours as needed Tylenol PM Unisom, no Gelcaps  Leg Cramps: Tums MagGel  Nausea/Vomiting:  Bonine Dramamine Emetrol Ginger extract Sea bands Meclizine  Nausea medication to take during pregnancy:  Unisom (doxylamine succinate 25 mg tablets) Take one tablet daily at bedtime. If symptoms are not adequately controlled, the dose can be increased to a maximum recommended dose of two tablets daily (1/2 tablet in  the morning, 1/2 tablet mid-afternoon and one at bedtime). Vitamin B6 160m tablets. Take one  tablet twice a day (up to 200 mg per day).  Skin Rashes: Aveeno products Benadryl cream or 250mevery 6 hours as needed Calamine Lotion 1% cortisone cream  Yeast infection: Gyne-lotrimin 7 Monistat 7   **If taking multiple medications, please check labels to avoid duplicating the same active ingredients **take medication as directed on the label ** Do not exceed 4000 mg of tylenol in 24 hours **Do not take medications that contain aspirin or ibuprofen

## 2022-04-08 LAB — CBC/D/PLT+RPR+RH+ABO+RUBIGG...
Antibody Screen: NEGATIVE
Basophils Absolute: 0.1 10*3/uL (ref 0.0–0.2)
Basos: 1 %
EOS (ABSOLUTE): 0.2 10*3/uL (ref 0.0–0.4)
Eos: 1 %
HCV Ab: NONREACTIVE
HIV Screen 4th Generation wRfx: NONREACTIVE
Hematocrit: 38.1 % (ref 34.0–46.6)
Hemoglobin: 12.5 g/dL (ref 11.1–15.9)
Hepatitis B Surface Ag: NEGATIVE
Immature Grans (Abs): 0.1 10*3/uL (ref 0.0–0.1)
Immature Granulocytes: 1 %
Lymphocytes Absolute: 2.6 10*3/uL (ref 0.7–3.1)
Lymphs: 21 %
MCH: 28.3 pg (ref 26.6–33.0)
MCHC: 32.8 g/dL (ref 31.5–35.7)
MCV: 86 fL (ref 79–97)
Monocytes Absolute: 0.9 10*3/uL (ref 0.1–0.9)
Monocytes: 7 %
Neutrophils Absolute: 8.9 10*3/uL — ABNORMAL HIGH (ref 1.4–7.0)
Neutrophils: 69 %
Platelets: 270 10*3/uL (ref 150–450)
RBC: 4.42 x10E6/uL (ref 3.77–5.28)
RDW: 13.1 % (ref 11.7–15.4)
RPR Ser Ql: NONREACTIVE
Rh Factor: POSITIVE
Rubella Antibodies, IGG: 2.78 index (ref >0.99)
WBC: 12.8 10*3/uL — ABNORMAL HIGH (ref 3.4–10.8)

## 2022-04-08 LAB — INTEGRATED 1
Crown Rump Length: 57.3 mm
Gest. Age on Collection Date: 12.1 weeks
Maternal Age at EDD: 30.8 yr
Nuchal Translucency (NT): 1.2 mm
Number of Fetuses: 1
PAPP-A Value: 962 ng/mL
Weight: 181 [lb_av]

## 2022-04-08 LAB — HEMOGLOBIN A1C
Est. average glucose Bld gHb Est-mCnc: 103 mg/dL
Hgb A1c MFr Bld: 5.2 % (ref 4.8–5.6)

## 2022-04-08 LAB — HCV INTERPRETATION

## 2022-04-09 LAB — URINE CULTURE

## 2022-04-10 LAB — GC/CHLAMYDIA PROBE AMP
Chlamydia trachomatis, NAA: NEGATIVE
Neisseria Gonorrhoeae by PCR: NEGATIVE

## 2022-04-18 LAB — PANORAMA PRENATAL TEST FULL PANEL:PANORAMA TEST PLUS 5 ADDITIONAL MICRODELETIONS: FETAL FRACTION: 9.3

## 2022-04-21 LAB — HORIZON CUSTOM: REPORT SUMMARY: POSITIVE — AB

## 2022-05-05 ENCOUNTER — Encounter: Payer: Medicaid Other | Admitting: Advanced Practice Midwife

## 2022-05-08 NOTE — L&D Delivery Note (Signed)
Delivery Note Katherine [name] is a 32 y.o. G3P1011 at [redacted]w[redacted]d admitted for IOL in the setting of A1GDM.   GBS Status: Negative/-- (05/13 1400) Maximum Maternal Temperature: 98.4  Labor course: Initial SVE: 2.5/60/-3. Augmentation with: AROM, Pitocin, and Cytotec. She then progressed to complete.  ROM: 6h 29m with clear fluid  Birth: At 1927 a viable female was delivered via spontaneous vaginal delivery (Presentation: cephalic; MOP). Nuchal cord present: No.  Shoulders and body delivered in usual fashion. Infant placed directly on mom's abdomen for bonding/skin-to-skin, baby dried and stimulated. Cord clamped x 2 after several minutes and cut by FOB-Jake. Cord blood collected. The placenta separated spontaneously and delivered via gentle cord traction.  Pitocin infused rapidly IV per protocol. Fundus firm with massage.  Placenta inspected and appears to be intact with a 3 VC.  Placenta/Cord with the following complications: none. Cord pH: n/a Sponge and instrument count were correct x2.  Intrapartum complications:  None Anesthesia:  epidural Episiotomy: none Lacerations:  none Suture Repair: n/a EBL (mL): 250   Infant: APGAR (1 MIN): 8   APGAR (5 MINS): 9   Infant weight: pending  Mom to postpartum.  Baby to Couplet care / Skin to Skin. Placenta to L&D   Plans to Breastfeed Contraception:  discuss outpatient Circumcision: wants inpatient  Note sent to [facility]: FT for pp visit.   Brand Males CNM 10/13/2022 7:47 PM

## 2022-05-10 ENCOUNTER — Ambulatory Visit (INDEPENDENT_AMBULATORY_CARE_PROVIDER_SITE_OTHER): Payer: Medicaid Other | Admitting: Family Medicine

## 2022-05-10 ENCOUNTER — Encounter: Payer: Self-pay | Admitting: Family Medicine

## 2022-05-10 ENCOUNTER — Encounter: Payer: Medicaid Other | Admitting: Family Medicine

## 2022-05-10 ENCOUNTER — Other Ambulatory Visit (HOSPITAL_COMMUNITY)
Admission: RE | Admit: 2022-05-10 | Discharge: 2022-05-10 | Disposition: A | Discharge status: Home / Self Care | Payer: Medicaid Other | Source: Ambulatory Visit | Attending: Obstetrics & Gynecology | Admitting: Obstetrics & Gynecology

## 2022-05-10 VITALS — BP 105/62 | HR 92 | Wt 180.0 lb

## 2022-05-10 DIAGNOSIS — Z124 Encounter for screening for malignant neoplasm of cervix: Secondary | ICD-10-CM

## 2022-05-10 DIAGNOSIS — Z3480 Encounter for supervision of other normal pregnancy, unspecified trimester: Secondary | ICD-10-CM

## 2022-05-10 DIAGNOSIS — Z3A17 17 weeks gestation of pregnancy: Secondary | ICD-10-CM | POA: Diagnosis present

## 2022-05-10 DIAGNOSIS — N393 Stress incontinence (female) (male): Secondary | ICD-10-CM

## 2022-05-10 DIAGNOSIS — Z1379 Encounter for other screening for genetic and chromosomal anomalies: Secondary | ICD-10-CM

## 2022-05-10 NOTE — Progress Notes (Signed)
   PRENATAL VISIT NOTE  Subjective:  Katherine [name] is a 32 y.o. G3P1011 at [redacted]w[redacted]d being seen today for ongoing prenatal care.  She is currently monitored for the following issues for this low-risk pregnancy and has Marijuana use; Excess body and facial hair; Encounter for supervision of normal pregnancy, antepartum; and Secondary oligomenorrhea on their problem list.  Patient reports  leaking urine. This is a chronic issue prior to this pregnancy and is intermittent throughout and occurs with certain activities. Open to pelvic floor PT .  Contractions: Not present. Vag. Bleeding: None.   . Denies leaking of fluid.   The following portions of the patient's history were reviewed and updated as appropriate: allergies, current medications, past family history, past medical history, past social history, past surgical history and problem list.   Objective:   Vitals:   05/10/22 0842  BP: 105/62  Pulse: 92  Weight: 180 lb (81.6 kg)    Fetal Status: Fetal Heart Rate (bpm): 150         General:  Alert, oriented and cooperative. Patient is in no acute distress.  Skin: Skin is warm and dry. No rash noted.   Cardiovascular: Normal heart rate noted  Respiratory: Normal respiratory effort, no problems with respiration noted  Abdomen: Soft, gravid, appropriate for gestational age.  Pain/Pressure: Present     Pelvic: Normal PAP collected         Extremities: Normal range of motion.     Mental Status: Normal mood and affect. Normal behavior. Normal judgment and thought content.   Assessment and Plan:  Pregnancy: G3P1011 at [redacted]w[redacted]d 1. Supervision of other normal pregnancy, antepartum Doing well.   2. [redacted] weeks gestation of pregnancy - Follow up in 4 week or sooner if needed  3. Encounter for genetic screening - INTEGRATED 2  4. Routine Papanicolaou smear - Cytology - PAP( [place])  5. Stress incontinence - Ambulatory referral to Physical Therapy   Preterm labor symptoms and general  obstetric precautions including but not limited to vaginal bleeding, contractions, leaking of fluid and fetal movement were reviewed in detail with the patient. Please refer to After Visit Summary for other counseling recommendations.   Return in about 4 weeks (around 06/07/2022) for LROB follow up.  Future Appointments  Date Time Provider [place]  06/09/2022  8:30 AM [facility] [place] [facility] None  06/09/2022  9:30 AM Florian Buff, MD [facility]  06/23/2022  8:45 AM Desenglau, Tommy Rainwater, PT [facility] None    Teruo Stilley Autry-Lott, DO

## 2022-05-12 LAB — CYTOLOGY - PAP
Comment: NEGATIVE
Diagnosis: NEGATIVE
High risk HPV: NEGATIVE

## 2022-05-12 LAB — INTEGRATED 2
AFP MoM: 0.58
Alpha-Fetoprotein: 18.7 ng/mL
Crown Rump Length: 57.3 mm
DIA MoM: 1.01
DIA Value: 136.3 pg/mL
Estriol, Unconjugated: 1.22 ng/mL
Gest. Age on Collection Date: 12.1 weeks
Gestational Age: 17 weeks
Maternal Age at EDD: 30.8 yr
Nuchal Translucency (NT): 1.2 mm
Nuchal Translucency MoM: 0.94
Number of Fetuses: 1
PAPP-A MoM: 1.35
PAPP-A Value: 962 ng/mL
Test Results:: NEGATIVE
Weight: 180 [lb_av]
Weight: 181 [lb_av]
hCG MoM: 1.14
hCG Value: 31 IU/mL
uE3 MoM: 1.1

## 2022-06-08 ENCOUNTER — Other Ambulatory Visit: Payer: Self-pay | Admitting: Obstetrics & Gynecology

## 2022-06-08 DIAGNOSIS — Z363 Encounter for antenatal screening for malformations: Secondary | ICD-10-CM

## 2022-06-09 ENCOUNTER — Ambulatory Visit (INDEPENDENT_AMBULATORY_CARE_PROVIDER_SITE_OTHER): Payer: Medicaid Other

## 2022-06-09 ENCOUNTER — Ambulatory Visit (INDEPENDENT_AMBULATORY_CARE_PROVIDER_SITE_OTHER): Payer: Medicaid Other | Admitting: Obstetrics & Gynecology

## 2022-06-09 ENCOUNTER — Encounter: Payer: Medicaid Other | Admitting: Obstetrics & Gynecology

## 2022-06-09 ENCOUNTER — Other Ambulatory Visit: Payer: Medicaid Other

## 2022-06-09 ENCOUNTER — Encounter: Payer: Self-pay | Admitting: Obstetrics & Gynecology

## 2022-06-09 VITALS — BP 103/65 | HR 79 | Wt 183.0 lb

## 2022-06-09 DIAGNOSIS — Z3A21 21 weeks gestation of pregnancy: Secondary | ICD-10-CM

## 2022-06-09 DIAGNOSIS — Z363 Encounter for antenatal screening for malformations: Secondary | ICD-10-CM

## 2022-06-09 DIAGNOSIS — Z3482 Encounter for supervision of other normal pregnancy, second trimester: Secondary | ICD-10-CM

## 2022-06-09 DIAGNOSIS — Z3480 Encounter for supervision of other normal pregnancy, unspecified trimester: Secondary | ICD-10-CM

## 2022-06-09 MED ORDER — OMEPRAZOLE 20 MG PO CPDR: 20.0000 mg | DELAYED_RELEASE_CAPSULE | Freq: Every day | ORAL | 6 refills | Status: DC

## 2022-06-09 NOTE — Progress Notes (Signed)
   LOW-RISK PREGNANCY VISIT Patient name: Katherine [name] MRN [id number]  Date of birth: [birth year] Chief Complaint:   Routine Prenatal Visit  History of Present Illness:   Katherine [name] is a 32 y.o. G65P1011 female at [redacted]w[redacted]d with an Estimated Date of Delivery: 10/16/22 being seen today for ongoing management of a low-risk pregnancy.     04/06/2022    3:17 PM 06/04/2020   11:16 AM 08/01/2019   10:03 AM 12/11/2016    2:38 PM  Depression screen PHQ 2/9  Decreased Interest 0 0 0 1  Down, Depressed, Hopeless 0 0 0 0  PHQ - 2 Score 0 0 0 1  Altered sleeping 1 0  0  Tired, decreased energy 1 1  2   Change in appetite 0 0  1  Feeling bad or failure about yourself  0 0  0  Trouble concentrating 0 0  0  Moving slowly or fidgety/restless 0   0  Suicidal thoughts 0   0  PHQ-9 Score 2 1  4   Difficult doing work/chores  Not difficult at all      Today she reports round ligament pain. Contractions: Not present. Vag. Bleeding: None.  Movement: Present. denies leaking of fluid. Review of Systems:   Pertinent items are noted in HPI Denies abnormal vaginal discharge w/ itching/odor/irritation, headaches, visual changes, shortness of breath, chest pain, abdominal pain, severe nausea/vomiting, or problems with urination or bowel movements unless otherwise stated above. Pertinent History Reviewed:  Reviewed past medical,surgical, social, obstetrical and family history.  Reviewed problem list, medications and allergies. Physical Assessment:   Vitals:   06/09/22 0920  BP: 103/65  Pulse: 79  Weight: 183 lb (83 kg)  Body mass index is 32.42 kg/m.        Physical Examination:   General appearance: Well appearing, and in no distress  Mental status: Alert, oriented to person, place, and time  Skin: Warm & dry  Cardiovascular: Normal heart rate noted  Respiratory: Normal respiratory effort, no distress  Abdomen: Soft, gravid, nontender  Pelvic: Cervical exam deferred         Extremities:    Fetal Status:      Movement: Present    Chaperone: n/a    No results found for this or any previous visit (from the past 24 hour(s)).  Assessment & Plan:  1) Low-risk pregnancy G3P1011 at [redacted]w[redacted]d with an Estimated Date of Delivery: 10/16/22   GERD-->omeprazole   Meds:  Meds ordered this encounter  Medications   omeprazole (PRILOSEC) 20 MG capsule    Sig: Take 1 capsule (20 mg total) by mouth daily. 1 tablet a day    Dispense:  30 capsule    Refill:  6   Labs/procedures today:   Plan:  Continue routine obstetrical care  Next visit: prefers in person      Follow-up: Return in about 4 weeks (around 07/07/2022) for repeat scan, LROB.  No orders of the defined types were placed in this encounter.   Florian Buff, MD 06/09/2022 9:36 AM

## 2022-06-09 NOTE — Progress Notes (Signed)
[place] 21+4 wks,breech,anterior placenta gr 0,CX 4.4 cm,SVP of fluid 4.5 cm,normal ovaries,FHR 135 bpm,EFW 450 g 54%,limited view of spine because of fetal position,no obvious abnormalities,please have pt come back today for additional images.

## 2022-06-22 NOTE — Therapy (Signed)
OUTPATIENT PHYSICAL THERAPY FEMALE PELVIC EVALUATION   Patient Name: Katherine [name] MRN: [id number] DOB:08/19/1990, 32 y.o., female Today's Date: 06/23/2022  END OF SESSION:  PT End of Session - 06/23/22 0858     Visit Number 1    Date for PT Re-Evaluation 09/15/22    Authorization Type [employer] - submit auth today    PT Start Time 0850    PT Stop Time 0928    PT Time Calculation (min) 38 min    Activity Tolerance Patient tolerated treatment well    Behavior During Therapy [facility] for tasks assessed/performed             Past Medical History:  Diagnosis Date   Bacterial vaginosis    Secondary oligomenorrhea    Past Surgical History:  Procedure Laterality Date   TONSILLECTOMY     Patient Active Problem List   Diagnosis Date Noted   Encounter for supervision of normal pregnancy, antepartum 04/06/2022   Secondary oligomenorrhea 04/06/2022   Excess body and facial hair 08/01/2019   Marijuana use 12/11/2016    PCP: [facility], [facility]   REFERRING PROVIDER: Christin Fudge, CNM   REFERRING DIAG: N39.3 (ICD-10-CM) - Stress incontinence   THERAPY DIAG:  Unspecified lack of coordination  Muscle weakness (generalized)  Abnormal posture  Rationale for Evaluation and Treatment: Rehabilitation  ONSET DATE: year ago  SUBJECTIVE:                                                                                                                                                                                           SUBJECTIVE STATEMENT: [redacted] weeks gestation I have a quarter size amount every time I use the bathroom.  A couple times coughing and laughing really hard.  If I throw up it will definitely leak also.   Fluid intake: Yes: a lot of water    PAIN:  Are you having pain? Yes NPRS scale: 2-3/10 Pain location:  round ligaments  Pain type: dull Pain description: intermittent   Aggravating factors: stretching my abs or turn too quickly I feel round  ligament pain Relieving factors:   PRECAUTIONS: None  WEIGHT BEARING RESTRICTIONS: No  FALLS:  Has patient fallen in last 6 months? No  LIVING ENVIRONMENT: Lives with: lives with their family, lives with their spouse, and brother and son Lives in: House/apartment   OCCUPATION: reception  PLOF: Independent  PATIENT GOALS: not have leakage  PERTINENT HISTORY:  Pregnant, previous pregnancy Sexual abuse: No  BOWEL MOVEMENT: Pain with bowel movement: No Type of bowel movement:Strain Yes sometimes, constipation during pregnancy sometimes Fully empty rectum: Yes:   Leakage: No  Pads: No Fiber supplement:   URINATION: Pain with urination: No Fully empty bladder: No Stream: Strong Urgency: No Frequency: every 2-3 hours Leakage: Coughing, Laughing, and throwing Pads: No  INTERCOURSE: Pain with intercourse:  No   PREGNANCY: Vaginal deliveries 1 Tearing No   PROLAPSE: None   OBJECTIVE:   DIAGNOSTIC FINDINGS:    PATIENT SURVEYS:    PFIQ-7 = 48 for bladder  COGNITION: Overall cognitive status: Within functional limits for tasks assessed     SENSATION: Light touch: Appears intact Proprioception: Appears intact  MUSCLE LENGTH: Hamstrings: WFL based on fwd flexion LUMBAR SPECIAL TESTS:  ASLR - Rt LE easier with compression for transversus abdominus assist, less stability in Rt side without compression  FUNCTIONAL TESTS:  Single leg normal with   GAIT:  Comments: WFL   POSTURE: rounded shoulders, increased lumbar lordosis, increased thoracic kyphosis, and anterior pelvic tilt  PELVIC ALIGNMENT: normal  LUMBARAROM/PROM:  A/PROM A/PROM  eval  Flexion   Extension   Right lateral flexion   Left lateral flexion   Right rotation   Left rotation    (Blank rows = not tested)  LOWER EXTREMITY ROM:  Passive ROM Right eval Left eval  Hip flexion    Hip extension    Hip abduction    Hip adduction    Hip internal rotation    Hip external  rotation    Knee flexion    Knee extension    Ankle dorsiflexion    Ankle plantarflexion    Ankle inversion    Ankle eversion     (Blank rows = not tested)  LOWER EXTREMITY MMT:  MMT Right eval Left eval  Hip flexion    Hip extension    Hip abduction    Hip adduction    Hip internal rotation    Hip external rotation    Knee flexion    Knee extension    Ankle dorsiflexion    Ankle plantarflexion    Ankle inversion    Ankle eversion     PALPATION:   General  lumbar and thoracic tight                External Perineal Exam normal                             Internal Pelvic Floor difficulty relaxing after contracting, low endurance  Patient confirms identification and approves PT to assess internal pelvic floor and treatment Yes  PELVIC MMT:   MMT eval  Vaginal 3/5 MMT, 10 reps, 2 sec hold  Internal Anal Sphincter   External Anal Sphincter   Puborectalis   Diastasis Recti   (Blank rows = not tested)        TONE: High Rt>Lt  PROLAPSE: no  TODAY'S TREATMENT:  DATE: 06/23/22  EVAL and initial HEP with education on posture and scap squeezes, double voiding Check all possible CPT codes: 97535 - Self Care    Check all conditions that are expected to impact treatment: Musculoskeletal disorders and Current pregnancy or recent postpartum   If treatment provided at initial evaluation, no treatment charged due to lack of authorization.        PATIENT EDUCATION:  Education details: Access Code: [email or web address] and double voiding techniques Person educated: Patient Education method: Explanation, Demonstration, Verbal cues, and Handouts Education comprehension: verbalized understanding, returned demonstration, and needs further education  HOME EXERCISE PROGRAM: Access Code: [email or web address] URL: [email or web address] Date:  06/23/2022 Prepared by: Jari Favre  Exercises - Standing 'L' Stretch at Counter  - 1 x daily - 7 x weekly - 3 sets - 10 reps - Seated Thoracic Lumbar Extension  - 1 x daily - 7 x weekly - 1 sets - 10 reps - 5 sec hold  ASSESSMENT:  CLINICAL IMPRESSION: Patient is a 32 y.o. female who was seen today for physical therapy evaluation and treatment for urinary incontinence. Pt is about [redacted] weeks gestation and has been having round ligament pain during pregnancy.  She has been having leakage for about a year or two.  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  Pt has pregnancy posture, tight low back, core weakness. Pt has some tension in pelvic floor with difficulty holding contracting and difficulty relaxing fully after contracting.  Pt will benefit from skilled PT to address coordination and strength along with all impairments mentioned above for improved function and quality of life.  OBJECTIVE IMPAIRMENTS: decreased coordination, decreased endurance, decreased ROM, decreased strength, increased muscle spasms, impaired tone, postural dysfunction, and pain.   ACTIVITY LIMITATIONS: sitting, continence, and toileting  PARTICIPATION LIMITATIONS: cleaning, driving, community activity, and occupation  PERSONAL FACTORS: Profession, Time since onset of injury/illness/exacerbation, and 1-2 comorbidities: pregnant, vaginal delivery  are also affecting patient's functional outcome.   REHAB POTENTIAL: Excellent  CLINICAL DECISION MAKING: Evolving/moderate complexity  EVALUATION COMPLEXITY: Moderate   GOALS: Goals reviewed with patient? Yes  SHORT TERM GOALS: Target date: 07/21/22  Pt will be ind with initial HEP Baseline: Goal status: INITIAL  2.  Pt will have knowledge of improvements in sitting posture for improved pressure management Baseline:  Goal status:  INITIAL    LONG TERM GOALS: Target date: 09/15/22  Pt will be independent with advanced HEP to maintain improvements made throughout therapy  Baseline:  Goal status: INITIAL  2.  Pt will not have leakage after voiding due to imporved muscle coordination Baseline:  Goal status: INITIAL  3.  Pt will have 30% less ligament pain due to improved core strength and posture Baseline:  Goal status: INITIAL  4.  Pt will be able to cough or if sick not have leakage Baseline:  Goal status: INITIAL   PLAN:  PT FREQUENCY: 1x/week  PT DURATION: 12 weeks  PLANNED INTERVENTIONS: Therapeutic exercises, Therapeutic activity, Neuromuscular re-education, Balance training, Gait training, Patient/Family education, Self Care, Joint mobilization, Aquatic Therapy, Dry Needling, Electrical stimulation, Cryotherapy, Moist heat, Taping, Ultrasound, Biofeedback, Manual therapy, and Re-evaluation  PLAN FOR NEXT SESSION: core strength, kegel with exhale, posture and pressure managemnt, f/u on double void   [employer], PT 06/23/2022, 11:03 AM

## 2022-06-23 ENCOUNTER — Other Ambulatory Visit: Payer: Self-pay

## 2022-06-23 ENCOUNTER — Ambulatory Visit: Payer: Medicaid Other | Attending: Advanced Practice Midwife | Admitting: Physical Therapy

## 2022-06-23 ENCOUNTER — Encounter: Payer: Self-pay | Admitting: Physical Therapy

## 2022-06-23 DIAGNOSIS — M6281 Muscle weakness (generalized): Secondary | ICD-10-CM | POA: Diagnosis present

## 2022-06-23 DIAGNOSIS — R293 Abnormal posture: Secondary | ICD-10-CM | POA: Diagnosis present

## 2022-06-23 DIAGNOSIS — R279 Unspecified lack of coordination: Secondary | ICD-10-CM | POA: Insufficient documentation

## 2022-06-23 DIAGNOSIS — N393 Stress incontinence (female) (male): Secondary | ICD-10-CM | POA: Insufficient documentation

## 2022-07-06 ENCOUNTER — Other Ambulatory Visit: Payer: Self-pay | Admitting: Obstetrics & Gynecology

## 2022-07-06 DIAGNOSIS — Z362 Encounter for other antenatal screening follow-up: Secondary | ICD-10-CM

## 2022-07-07 ENCOUNTER — Other Ambulatory Visit: Payer: Self-pay | Admitting: Obstetrics & Gynecology

## 2022-07-07 ENCOUNTER — Ambulatory Visit (INDEPENDENT_AMBULATORY_CARE_PROVIDER_SITE_OTHER): Payer: Medicaid Other | Admitting: Advanced Practice Midwife

## 2022-07-07 ENCOUNTER — Encounter: Payer: Self-pay | Admitting: Advanced Practice Midwife

## 2022-07-07 ENCOUNTER — Ambulatory Visit (INDEPENDENT_AMBULATORY_CARE_PROVIDER_SITE_OTHER): Payer: Medicaid Other

## 2022-07-07 VITALS — BP 107/67 | HR 73

## 2022-07-07 DIAGNOSIS — Z3A25 25 weeks gestation of pregnancy: Secondary | ICD-10-CM

## 2022-07-07 DIAGNOSIS — Z362 Encounter for other antenatal screening follow-up: Secondary | ICD-10-CM | POA: Diagnosis not present

## 2022-07-07 DIAGNOSIS — Z3480 Encounter for supervision of other normal pregnancy, unspecified trimester: Secondary | ICD-10-CM

## 2022-07-07 DIAGNOSIS — Z3482 Encounter for supervision of other normal pregnancy, second trimester: Secondary | ICD-10-CM

## 2022-07-07 NOTE — Progress Notes (Signed)
   LOW-RISK PREGNANCY VISIT Patient name: Katherine [name] MRN [id number]  Date of birth: 1990-07-28 Chief Complaint:   Routine Prenatal Visit (U/s f/u on spine)  History of Present Illness:   Katherine [name] is a 32 y.o. G61P1011 female at 81w4dwith an Estimated Date of Delivery: 10/16/22 being seen today for ongoing management of a low-risk pregnancy.  Today she reports  feeling a lot of low FM at times . Contractions: Not present. Vag. Bleeding: None.  Movement: Present. denies leaking of fluid. Review of Systems:   Pertinent items are noted in HPI Denies abnormal vaginal discharge w/ itching/odor/irritation, headaches, visual changes, shortness of breath, chest pain, abdominal pain, severe nausea/vomiting, or problems with urination or bowel movements unless otherwise stated above. Pertinent History Reviewed:  Reviewed past medical,surgical, social, obstetrical and family history.  Reviewed problem list, medications and allergies. Physical Assessment:   Vitals:   07/07/22 1155  BP: 107/67  Pulse: 73  There is no height or weight on file to calculate BMI.        Physical Examination:   General appearance: Well appearing, and in no distress  Mental status: Alert, oriented to person, place, and time  Skin: Warm & dry  Cardiovascular: Normal heart rate noted  Respiratory: Normal respiratory effort, no distress  Abdomen: Soft, gravid, nontender  Pelvic: Cervical exam deferred         Extremities: Edema: None  Fetal Status: Fetal Heart Rate (bpm): 133 u/s   Movement: Present    F/U anatomy (spine): [place]25+4 wks,breech,anterior placenta gr 0,normal ovaries,cx 3.9 cm,SVP of fluid 6.5 cm,FHR 133 bpm,EFW 818 g 36%,anatomy of the spine complete,no obvious abnormalities   No results found for this or any previous visit (from the past 24 hour(s)).  Assessment & Plan:  1) Low-risk pregnancy G3P1011 at 253w4dith an Estimated Date of Delivery: 10/16/22     Meds: No orders of the defined types were  placed in this encounter.  Labs/procedures today: f/u anatomy u/s  Plan:  Continue routine obstetrical care   Reviewed: Preterm labor symptoms and general obstetric precautions including but not limited to vaginal bleeding, contractions, leaking of fluid and fetal movement were reviewed in detail with the patient.  All questions were answered. Has home bp cuff. Check bp weekly, let us[place]ow if >140/90.   Follow-up: Return in about 3 weeks (around 07/28/2022) for LROB, PN2.  No orders of the defined types were placed in this encounter.  KiMyrtis SerNM 07/07/2022 12:11 PM

## 2022-07-07 NOTE — Patient Instructions (Signed)
Claudie Fisherman, I greatly value your feedback.  If you receive a survey following your visit with [place] today, we appreciate you taking the time to fill it out.  Thanks, Derrill Memo, CNM   You will have your sugar test next visit.  Please do not eat or drink anything after midnight the night before you come, not even water.  You will be here for at least two hours.  Please make an appointment online for the bloodwork at [email or web address] for 8:30am (or as close to this as possible). Make sure you select the [facility] service center. The day of the appointment, check in with our office first, then you will go to [place] to start the sugar test.    [place]!!! It is now [place] at [facility] ([place], [place] [address]) Entrance C, located off of [place] parking  Go to [email or web address] to register for FREE online childbirth classes   Call the office [phone]) or go to [facility] if: You begin to have strong, frequent contractions Your water breaks.  Sometimes it is a big gush of fluid, sometimes it is just a trickle that keeps getting your panties wet or running down your legs You have vaginal bleeding.  It is normal to have a small amount of spotting if your cervix was checked.  You don't feel your baby moving like normal.  If you don't, get you something to eat and drink and lay down and focus on feeling your baby move.   If your baby is still not moving like normal, you should call the office or go to [place] [facility]Family [facility]: [place] [phone]                [place] [phone] (usually not accepting new patients unless you have family there already, you are always welcome to call and ask)      [facility] Department [phone]       [facility] Pediatricians/Family Doctors:  [facility]: [phone] [facility]: [phone] [facility]: [place] Doctors:  [facility]: [place] [facility]: [place]: [place]: [phone]   Home Blood Pressure Monitoring for Patients   Your provider has recommended that you check your blood pressure (BP) at least once a week at home. If you do not have a blood pressure cuff at home, one will be provided for you. Contact your provider if you have not received your monitor within 1 week.   Helpful Tips for Accurate Home Blood Pressure Checks  Don't smoke, exercise, or drink caffeine 30 minutes before checking your BP Use the restroom before checking your BP (a full bladder can raise your pressure) Relax in a comfortable upright chair Feet on the ground Left arm resting comfortably on a flat surface at the level of your heart Legs uncrossed Back supported Sit quietly and don't talk Place the cuff on your bare arm Adjust snuggly, so that only two fingertips can fit between your skin and the top of the cuff Check 2 readings separated by at least one minute Keep a log of your BP readings For a visual, please reference this diagram: http://ccnc.care/bpdiagram  Provider Name: [facility]     Phone: [phone]  Zone 1: ALL CLEAR  Continue to monitor your symptoms:  BP reading is less than 140 (top number) or less than 90 (bottom number)  No right upper stomach pain No headaches or seeing spots No feeling nauseated or throwing up No swelling in face and hands  Zone 2: CAUTION Call your doctor's office for any of the following:  BP reading is greater than 140 (top number) or greater than 90 (bottom number)  Stomach pain under your ribs in the middle or right side Headaches or seeing spots Feeling nauseated or throwing up Swelling in face and hands  Zone 3: EMERGENCY  Seek  immediate medical care if you have any of the following:  BP reading is greater than160 (top number) or greater than 110 (bottom number) Severe headaches not improving with Tylenol Serious difficulty catching your breath Any worsening symptoms from Zone 2   Second Trimester of Pregnancy The second trimester is from week 13 through week 28, months 4 through 6. The second trimester is often a time when you feel your best. Your body has also adjusted to being pregnant, and you begin to feel better physically. Usually, morning sickness has lessened or quit completely, you may have more energy, and you may have an increase in appetite. The second trimester is also a time when the fetus is growing rapidly. At the end of the sixth month, the fetus is about 9 inches long and weighs about 1 pounds. You will likely begin to feel the baby move (quickening) between 18 and 20 weeks of the pregnancy. BODY CHANGES Your body goes through many changes during pregnancy. The changes vary from woman to woman.  Your weight will continue to increase. You will notice your lower abdomen bulging out. You may begin to get stretch marks on your hips, abdomen, and breasts. You may develop headaches that can be relieved by medicines approved by your health care provider. You may urinate more often because the fetus is pressing on your bladder. You may develop or continue to have heartburn as a result of your pregnancy. You may develop constipation because certain hormones are causing the muscles that push waste through your intestines to slow down. You may develop hemorrhoids or swollen, bulging veins (varicose veins). You may have back pain because of the weight gain and pregnancy hormones relaxing your joints between the bones in your pelvis and as a result of a shift in weight and the muscles that support your balance. Your breasts will continue to grow and be tender. Your gums may bleed and may be sensitive to brushing  and flossing. Dark spots or blotches (chloasma, mask of pregnancy) may develop on your face. This will likely fade after the baby is born. A dark line from your belly button to the pubic area (linea nigra) may appear. This will likely fade after the baby is born. You may have changes in your hair. These can include thickening of your hair, rapid growth, and changes in texture. Some women also have hair loss during or after pregnancy, or hair that feels dry or thin. Your hair will most likely return to normal after your baby is born. WHAT TO EXPECT AT YOUR PRENATAL VISITS During a routine prenatal visit: You will be weighed to make sure you and the fetus are growing normally. Your blood pressure will be taken. Your abdomen will be measured to track your baby's growth. The fetal heartbeat will be listened to. Any test results from the previous visit will be discussed. Your health care provider  may ask you: How you are feeling. If you are feeling the baby move. If you have had any abnormal symptoms, such as leaking fluid, bleeding, severe headaches, or abdominal cramping. If you have any questions. Other tests that may be performed during your second trimester include: Blood tests that check for: Low iron levels (anemia). Gestational diabetes (between 24 and 28 weeks). Rh antibodies. Urine tests to check for infections, diabetes, or protein in the urine. An ultrasound to confirm the proper growth and development of the baby. An amniocentesis to check for possible genetic problems. Fetal screens for spina bifida and Down syndrome. HOME CARE INSTRUCTIONS  Avoid all smoking, herbs, alcohol, and unprescribed drugs. These chemicals affect the formation and growth of the baby. Follow your health care provider's instructions regarding medicine use. There are medicines that are either safe or unsafe to take during pregnancy. Exercise only as directed by your health care provider. Experiencing  uterine cramps is a good sign to stop exercising. Continue to eat regular, healthy meals. Wear a good support bra for breast tenderness. Do not use hot tubs, steam rooms, or saunas. Wear your seat belt at all times when driving. Avoid raw meat, uncooked cheese, cat litter boxes, and soil used by cats. These carry germs that can cause birth defects in the baby. Take your prenatal vitamins. Try taking a stool softener (if your health care provider approves) if you develop constipation. Eat more high-fiber foods, such as fresh vegetables or fruit and whole grains. Drink plenty of fluids to keep your urine clear or pale yellow. Take warm sitz baths to soothe any pain or discomfort caused by hemorrhoids. Use hemorrhoid cream if your health care provider approves. If you develop varicose veins, wear support hose. Elevate your feet for 15 minutes, 3-4 times a day. Limit salt in your diet. Avoid heavy lifting, wear low heel shoes, and practice good posture. Rest with your legs elevated if you have leg cramps or low back pain. Visit your dentist if you have not gone yet during your pregnancy. Use a soft toothbrush to brush your teeth and be gentle when you floss. A sexual relationship may be continued unless your health care provider directs you otherwise. Continue to go to all your prenatal visits as directed by your health care provider. SEEK MEDICAL CARE IF:  You have dizziness. You have mild pelvic cramps, pelvic pressure, or nagging pain in the abdominal area. You have persistent nausea, vomiting, or diarrhea. You have a bad smelling vaginal discharge. You have pain with urination. SEEK IMMEDIATE MEDICAL CARE IF:  You have a fever. You are leaking fluid from your vagina. You have spotting or bleeding from your vagina. You have severe abdominal cramping or pain. You have rapid weight gain or loss. You have shortness of breath with chest pain. You notice sudden or extreme swelling of your face,  hands, ankles, feet, or legs. You have not felt your baby move in over an hour. You have severe headaches that do not go away with medicine. You have vision changes. Document Released: 04/18/2001 Document Revised: 04/29/2013 Document Reviewed: 06/25/2012 [facility] Patient Information 2015 [place], [place]. This information is not intended to replace advice given to you by your health care provider. Make sure you discuss any questions you have with your health care provider.

## 2022-07-07 NOTE — Progress Notes (Signed)
Orders for follow up anatomy scan

## 2022-07-07 NOTE — Progress Notes (Signed)
[place] 25+4 wks,breech,anterior placenta gr 0,normal ovaries,cx 3.9 cm,SVP of fluid 6.5 cm,FHR 133 bpm,EFW 818 g 36%,anatomy of the spine complete,no obvious abnormalities

## 2022-07-21 ENCOUNTER — Ambulatory Visit (INDEPENDENT_AMBULATORY_CARE_PROVIDER_SITE_OTHER): Payer: Medicaid Other | Admitting: Advanced Practice Midwife

## 2022-07-21 ENCOUNTER — Encounter: Payer: Self-pay | Admitting: Advanced Practice Midwife

## 2022-07-21 ENCOUNTER — Other Ambulatory Visit: Payer: Medicaid Other

## 2022-07-21 VITALS — BP 105/68 | HR 93 | Wt 184.0 lb

## 2022-07-21 DIAGNOSIS — Z23 Encounter for immunization: Secondary | ICD-10-CM

## 2022-07-21 DIAGNOSIS — Z131 Encounter for screening for diabetes mellitus: Secondary | ICD-10-CM

## 2022-07-21 DIAGNOSIS — Z3482 Encounter for supervision of other normal pregnancy, second trimester: Secondary | ICD-10-CM | POA: Diagnosis not present

## 2022-07-21 DIAGNOSIS — Z148 Genetic carrier of other disease: Secondary | ICD-10-CM

## 2022-07-21 DIAGNOSIS — Z3A27 27 weeks gestation of pregnancy: Secondary | ICD-10-CM | POA: Diagnosis not present

## 2022-07-21 DIAGNOSIS — Z3480 Encounter for supervision of other normal pregnancy, unspecified trimester: Secondary | ICD-10-CM

## 2022-07-21 MED ORDER — ACYCLOVIR 400 MG PO TABS: 400.0000 mg | ORAL_TABLET | Freq: Three times a day (TID) | ORAL | 4 refills | Status: DC

## 2022-07-21 NOTE — Progress Notes (Signed)
   LOW-RISK PREGNANCY VISIT Patient name: Katherine [name] MRN [id number]  Date of birth: Feb 12, 1991 Chief Complaint:   Routine Prenatal Visit (PN2/Ear pain)  History of Present Illness:   Katherine [name] is a 32 y.o. G53P1011 female at [redacted]w[redacted]d with an Estimated Date of Delivery: 10/16/22 being seen today for ongoing management of a low-risk pregnancy.  Today she reports  tenderness on external R ear; also had cold sore come up on her L lip . Contractions: Not present. Vag. Bleeding: None.  Movement: Present. denies leaking of fluid. Review of Systems:   Pertinent items are noted in HPI Denies abnormal vaginal discharge w/ itching/odor/irritation, headaches, visual changes, shortness of breath, chest pain, abdominal pain, severe nausea/vomiting, or problems with urination or bowel movements unless otherwise stated above. Pertinent History Reviewed:  Reviewed past medical,surgical, social, obstetrical and family history.  Reviewed problem list, medications and allergies. Physical Assessment:   Vitals:   07/21/22 0829  BP: 105/68  Pulse: 93  Weight: 184 lb (83.5 kg)  Body mass index is 32.59 kg/m.        Physical Examination:   General appearance: Well appearing, and in no distress  Mental status: Alert, oriented to person, place, and time  Skin: Warm & dry  Cardiovascular: Normal heart rate noted  Respiratory: Normal respiratory effort, no distress  Abdomen: Soft, gravid, nontender  Pelvic: Cervical exam deferred         Extremities: Edema: None  Fetal Status: Fetal Heart Rate (bpm): 142 Fundal Height: 27 cm Movement: Present    No results found for this or any previous visit (from the past 24 hour(s)).  Assessment & Plan:  1) Low-risk pregnancy G3P1011 at [redacted]w[redacted]d with an Estimated Date of Delivery: 10/16/22   2) R ear pain, some cerumen but no s/s infection; monitor for now  3) Cold sore on lip, rx acyclovir   Meds:  Meds ordered this encounter  Medications   acyclovir (ZOVIRAX) 400 MG  tablet    Sig: Take 1 tablet (400 mg total) by mouth 3 (three) times daily for 5 days.    Dispense:  15 tablet    Refill:  4    Order Specific Question:   Supervising Provider    Answer:   Janyth Pupa [id number]   Labs/procedures today: PN2; Tdap given  Plan:  Continue routine obstetrical care   Reviewed: Preterm labor symptoms and general obstetric precautions including but not limited to vaginal bleeding, contractions, leaking of fluid and fetal movement were reviewed in detail with the patient.  All questions were answered. Has home bp cuff. Check bp weekly, let us know if >140/90.   Follow-up: Return in about 3 weeks (around 08/11/2022) for LROB, in person.  Orders Placed This Encounter  Procedures   Tdap vaccine greater than or equal to 7yo IM   [facility]rtis Ser [facility] 07/21/2022 9:07 AM

## 2022-07-21 NOTE — Patient Instructions (Signed)
Katherine [name], thank you for choosing our office today! We appreciate the opportunity to meet your healthcare needs. You may receive a short survey by mail, e-mail, or through [employer]. If you are happy with your care we would appreciate if you could take just a few minutes to complete the survey questions. We read all of your comments and take your feedback very seriously. Thank you again for choosing our office.  Center for [employer] Team at [place] at [facility] ([place], [place] [address]) Entrance C, located off of [address] parking   CLASSES: Go to [email or web address] to register for classes (childbirth, breastfeeding, waterbirth, infant CPR, daddy bootcamp, etc.)  Call the office [phone]) or go to [facility] if: You begin to have strong, frequent contractions Your water breaks.  Sometimes it is a big gush of fluid, sometimes it is just a trickle that keeps getting your panties wet or running down your legs You have vaginal bleeding.  It is normal to have a small amount of spotting if your cervix was checked.  You don't feel your baby moving like normal.  If you don't, get you something to eat and drink and lay down and focus on feeling your baby move.   If your baby is still not moving like normal, you should call the office or go to [facility].  Call the office [phone]) or go to [facility] hospital for these signs of pre-eclampsia: Severe headache that does not go away with Tylenol Visual changes- seeing spots, double, blurred vision Pain under your right breast or upper abdomen that does not go away with Tums or heartburn medicine Nausea and/or vomiting Severe swelling in your hands, feet, and face   Tdap Vaccine It is recommended that you get the Tdap vaccine during the third trimester of EACH pregnancy to help protect your baby from getting pertussis (whooping cough) 27-36 weeks is the BEST time to do  this so that you can pass the protection on to your baby. During pregnancy is better than after pregnancy, but if you are unable to get it during pregnancy it will be offered at the hospital.  You can get this vaccine with [place], at the health department, your family doctor, or some local pharmacies Everyone who will be around your baby should also be up-to-date on their vaccines before the baby comes. Adults (who are not pregnant) only need 1 dose of Tdap during adulthood.   [facility] Pediatricians/Family Doctors [facility]): [address] [place], [facility]: [address] [place], [phone]                [facility]): [place], [phone] (call to ask if accepting patients) [facility] Department: [address], [employer], [place] Pediatricians/Family Doctors [facility]): [place]. [place], [address], [place] [facility]: [address] [place], [place] [facility] [place] [facility] [place]: [place], [place] [facility]): [phone] [facility]: [address] [place]: [address], [place] [facility]: [phone], [phone]  Home Blood Pressure Monitoring for Patients   Your provider has recommended that you check your  blood pressure (BP) at least once a week at home. If you do not have a blood pressure cuff at home, one will be provided for you. Contact your provider if you have not received your monitor within 1 week.   Helpful Tips for Accurate Home Blood Pressure Checks  Don't smoke, exercise, or drink caffeine 30 minutes before checking your BP Use the restroom before checking your BP (a full bladder can raise your  pressure) Relax in a comfortable upright chair Feet on the ground Left arm resting comfortably on a flat surface at the level of your heart Legs uncrossed Back supported Sit quietly and don't talk Place the cuff on your bare arm Adjust snuggly, so that only two fingertips can fit between your skin and the top of the cuff Check 2 readings separated by at least one minute Keep a log of your BP readings For a visual, please reference this diagram: http://ccnc.care/bpdiagram  Provider Name: [facility]     Phone: [phone]  Zone 1: ALL CLEAR  Continue to monitor your symptoms:  BP reading is less than 140 (top number) or less than 90 (bottom number)  No right upper stomach pain No headaches or seeing spots No feeling nauseated or throwing up No swelling in face and hands  Zone 2: CAUTION Call your doctor's office for any of the following:  BP reading is greater than 140 (top number) or greater than 90 (bottom number)  Stomach pain under your ribs in the middle or right side Headaches or seeing spots Feeling nauseated or throwing up Swelling in face and hands  Zone 3: EMERGENCY  Seek immediate medical care if you have any of the following:  BP reading is greater than160 (top number) or greater than 110 (bottom number) Severe headaches not improving with Tylenol Serious difficulty catching your breath Any worsening symptoms from Zone 2   Third Trimester of Pregnancy The third trimester is from week 29 through week 42, months 7 through 9. The third trimester is a time when the fetus is growing rapidly. At the end of the ninth month, the fetus is about 20 inches in length and weighs 6-10 pounds.  BODY CHANGES Your body goes through many changes during pregnancy. The changes vary from woman to woman.  Your weight will continue to increase. You can expect to gain 25-35 pounds (11-16 kg) by the end of the pregnancy. You may begin to get stretch marks on your hips, abdomen,  and breasts. You may urinate more often because the fetus is moving lower into your pelvis and pressing on your bladder. You may develop or continue to have heartburn as a result of your pregnancy. You may develop constipation because certain hormones are causing the muscles that push waste through your intestines to slow down. You may develop hemorrhoids or swollen, bulging veins (varicose veins). You may have pelvic pain because of the weight gain and pregnancy hormones relaxing your joints between the bones in your pelvis. Backaches may result from overexertion of the muscles supporting your posture. You may have changes in your hair. These can include thickening of your hair, rapid growth, and changes in texture. Some women also have hair loss during or after pregnancy, or hair that feels dry or thin. Your hair will most likely return to normal after your baby is born. Your breasts will continue to grow and be tender. A yellow discharge may leak from your breasts called colostrum. Your belly button may stick out. You may   feel short of breath because of your expanding uterus. You may notice the fetus "dropping," or moving lower in your abdomen. You may have a bloody mucus discharge. This usually occurs a few days to a week before labor begins. Your cervix becomes thin and soft (effaced) near your due date. WHAT TO EXPECT AT YOUR PRENATAL EXAMS  You will have prenatal exams every 2 weeks until week 36. Then, you will have weekly prenatal exams. During a routine prenatal visit: You will be weighed to make sure you and the fetus are growing normally. Your blood pressure is taken. Your abdomen will be measured to track your baby's growth. The fetal heartbeat will be listened to. Any test results from the previous visit will be discussed. You may have a cervical check near your due date to see if you have effaced. At around 36 weeks, your caregiver will check your cervix. At the same time, your  caregiver will also perform a test on the secretions of the vaginal tissue. This test is to determine if a type of bacteria, Group B streptococcus, is present. Your caregiver will explain this further. Your caregiver may ask you: What your birth plan is. How you are feeling. If you are feeling the baby move. If you have had any abnormal symptoms, such as leaking fluid, bleeding, severe headaches, or abdominal cramping. If you have any questions. Other tests or screenings that may be performed during your third trimester include: Blood tests that check for low iron levels (anemia). Fetal testing to check the health, activity level, and growth of the fetus. Testing is done if you have certain medical conditions or if there are problems during the pregnancy. FALSE LABOR You may feel small, irregular contractions that eventually go away. These are called Braxton Hicks contractions, or false labor. Contractions may last for hours, days, or even weeks before true labor sets in. If contractions come at regular intervals, intensify, or become painful, it is best to be seen by your caregiver.  SIGNS OF LABOR  Menstrual-like cramps. Contractions that are 5 minutes apart or less. Contractions that start on the top of the uterus and spread down to the lower abdomen and back. A sense of increased pelvic pressure or back pain. A watery or bloody mucus discharge that comes from the vagina. If you have any of these signs before the 37th week of pregnancy, call your caregiver right away. You need to go to the hospital to get checked immediately. HOME CARE INSTRUCTIONS  Avoid all smoking, herbs, alcohol, and unprescribed drugs. These chemicals affect the formation and growth of the baby. Follow your caregiver's instructions regarding medicine use. There are medicines that are either safe or unsafe to take during pregnancy. Exercise only as directed by your caregiver. Experiencing uterine cramps is a good sign to  stop exercising. Continue to eat regular, healthy meals. Wear a good support bra for breast tenderness. Do not use hot tubs, steam rooms, or saunas. Wear your seat belt at all times when driving. Avoid raw meat, uncooked cheese, cat litter boxes, and soil used by cats. These carry germs that can cause birth defects in the baby. Take your prenatal vitamins. Try taking a stool softener (if your caregiver approves) if you develop constipation. Eat more high-fiber foods, such as fresh vegetables or fruit and whole grains. Drink plenty of fluids to keep your urine clear or pale yellow. Take warm sitz baths to soothe any pain or discomfort caused by hemorrhoids. Use hemorrhoid cream if   your caregiver approves. If you develop varicose veins, wear support hose. Elevate your feet for 15 minutes, 3-4 times a day. Limit salt in your diet. Avoid heavy lifting, wear low heal shoes, and practice good posture. Rest a lot with your legs elevated if you have leg cramps or low back pain. Visit your dentist if you have not gone during your pregnancy. Use a soft toothbrush to brush your teeth and be gentle when you floss. A sexual relationship may be continued unless your caregiver directs you otherwise. Do not travel far distances unless it is absolutely necessary and only with the approval of your caregiver. Take prenatal classes to understand, practice, and ask questions about the labor and delivery. Make a trial run to the hospital. Pack your hospital bag. Prepare the baby's nursery. Continue to go to all your prenatal visits as directed by your caregiver. SEEK MEDICAL CARE IF: You are unsure if you are in labor or if your water has broken. You have dizziness. You have mild pelvic cramps, pelvic pressure, or nagging pain in your abdominal area. You have persistent nausea, vomiting, or diarrhea. You have a bad smelling vaginal discharge. You have pain with urination. SEEK IMMEDIATE MEDICAL CARE IF:  You  have a fever. You are leaking fluid from your vagina. You have spotting or bleeding from your vagina. You have severe abdominal cramping or pain. You have rapid weight loss or gain. You have shortness of breath with chest pain. You notice sudden or extreme swelling of your face, hands, ankles, feet, or legs. You have not felt your baby move in over an hour. You have severe headaches that do not go away with medicine. You have vision changes. Document Released: 04/18/2001 Document Revised: 04/29/2013 Document Reviewed: 06/25/2012 [facility] Patient Information 2015 [place], [place]. This information is not intended to replace advice given to you by your health care provider. Make sure you discuss any questions you have with your health care provider.

## 2022-07-22 LAB — CBC
Hematocrit: 34.9 % (ref 34.0–46.6)
Hemoglobin: 11.4 g/dL (ref 11.1–15.9)
MCH: 28.3 pg (ref 26.6–33.0)
MCHC: 32.7 g/dL (ref 31.5–35.7)
MCV: 87 fL (ref 79–97)
Platelets: 258 10*3/uL (ref 150–450)
RBC: 4.03 x10E6/uL (ref 3.77–5.28)
RDW: 13.6 % (ref 11.7–15.4)
WBC: 10.2 10*3/uL (ref 3.4–10.8)

## 2022-07-22 LAB — GLUCOSE TOLERANCE, 2 HOURS W/ 1HR
Glucose, 1 hour: 226 mg/dL — ABNORMAL HIGH (ref 70–179)
Glucose, 2 hour: 170 mg/dL — ABNORMAL HIGH (ref 70–152)
Glucose, Fasting: 89 mg/dL (ref 70–91)

## 2022-07-22 LAB — RPR: RPR Ser Ql: NONREACTIVE

## 2022-07-22 LAB — HIV ANTIBODY (ROUTINE TESTING W REFLEX): HIV Screen 4th Generation wRfx: NONREACTIVE

## 2022-07-22 LAB — ANTIBODY SCREEN: Antibody Screen: NEGATIVE

## 2022-07-24 ENCOUNTER — Telehealth: Payer: Self-pay | Admitting: *Deleted

## 2022-07-24 ENCOUNTER — Other Ambulatory Visit: Payer: Self-pay | Admitting: *Deleted

## 2022-07-24 ENCOUNTER — Encounter: Payer: Self-pay | Admitting: Women's Health

## 2022-07-24 ENCOUNTER — Encounter: Payer: Self-pay | Admitting: Advanced Practice Midwife

## 2022-07-24 DIAGNOSIS — O24410 Gestational diabetes mellitus in pregnancy, diet controlled: Secondary | ICD-10-CM

## 2022-07-24 DIAGNOSIS — O24419 Gestational diabetes mellitus in pregnancy, unspecified control: Secondary | ICD-10-CM | POA: Insufficient documentation

## 2022-07-24 MED ORDER — ACCU-CHEK GUIDE ME W/DEVICE KIT: 1.0000 | PACK | Freq: Four times a day (QID) | 0 refills | Status: DC

## 2022-07-24 MED ORDER — ACCU-CHEK GUIDE VI STRP: ORAL_STRIP | 12 refills | Status: DC

## 2022-07-24 MED ORDER — ACCU-CHEK SOFTCLIX LANCETS MISC: 12 refills | Status: DC

## 2022-07-24 NOTE — Telephone Encounter (Signed)
Called patient to go over questions and concerns regarding new diagnosis of GDM.  All questions answered.

## 2022-07-28 ENCOUNTER — Encounter: Payer: Medicaid Other | Admitting: Obstetrics & Gynecology

## 2022-07-28 ENCOUNTER — Other Ambulatory Visit: Payer: Medicaid Other

## 2022-08-09 ENCOUNTER — Ambulatory Visit: Payer: Medicaid Other | Admitting: Physical Therapy

## 2022-08-11 ENCOUNTER — Encounter: Payer: Medicaid Other | Admitting: Advanced Practice Midwife

## 2022-08-11 ENCOUNTER — Ambulatory Visit (INDEPENDENT_AMBULATORY_CARE_PROVIDER_SITE_OTHER): Payer: Medicaid Other | Admitting: Advanced Practice Midwife

## 2022-08-11 ENCOUNTER — Encounter: Payer: Self-pay | Admitting: Advanced Practice Midwife

## 2022-08-11 VITALS — BP 105/66 | HR 88 | Wt 184.4 lb

## 2022-08-11 DIAGNOSIS — Z3A30 30 weeks gestation of pregnancy: Secondary | ICD-10-CM

## 2022-08-11 DIAGNOSIS — O0993 Supervision of high risk pregnancy, unspecified, third trimester: Secondary | ICD-10-CM

## 2022-08-11 DIAGNOSIS — O24410 Gestational diabetes mellitus in pregnancy, diet controlled: Secondary | ICD-10-CM

## 2022-08-11 NOTE — Patient Instructions (Signed)
Katherine [name], thank you for choosing our office today! We appreciate the opportunity to meet your healthcare needs. You may receive a short survey by mail, e-mail, or through MyChart. If you are happy with your care we would appreciate if you could take just a few minutes to complete the survey questions. We read all of your comments and take your feedback very seriously. Thank you again for choosing our office.  [facility] ([address] [place], [place] [address]) Entrance C, located off of [address] Free 24/7 valet parking   CLASSES: Go to [email or web address] to register for classes (childbirth, breastfeeding, waterbirth, infant CPR, daddy bootcamp, etc.)  Call the office ([phone]) or go to [facility] if: You begin to have strong, frequent contractions Your water breaks.  Sometimes it is a big gush of fluid, sometimes it is just a trickle that keeps getting your panties wet or running down your legs You have vaginal bleeding.  It is normal to have a small amount of spotting if your cervix was checked.  You don't feel your baby moving like normal.  If you don't, get you something to eat and drink and lay down and focus on feeling your baby move.   If your baby is still not moving like normal, you should call the office or go to [facility].  Call the office ([phone]) or go to [facility] for these signs of pre-eclampsia: Severe headache that does not go away with Tylenol Visual changes- seeing spots, double, blurred vision Pain under your right breast or upper abdomen that does not go away with Tums or heartburn medicine Nausea and/or vomiting Severe swelling in your hands, feet, and face   Tdap Vaccine It is recommended that you get the Tdap vaccine during the third trimester of EACH pregnancy to help protect your baby from getting pertussis (whooping cough) 27-36 weeks is the BEST time to do  this so that you can pass the protection on to your baby. During pregnancy is better than after pregnancy, but if you are unable to get it during pregnancy it will be offered at the hospital.  You can get this vaccine with us, at the health department, your family doctor, or some local pharmacies Everyone who will be around your baby should also be up-to-date on their vaccines before the baby comes. Adults (who are not pregnant) only need 1 dose of Tdap during adulthood.   [place] Pediatricians/Family Doctors [facility] ([facility]): [address], [phone]           [facility]: [address], [phone]                [facility] ([facility]): [address], [phone] (call to ask if accepting patients) [facility]: [address], [place], [phone]    [place] Pediatricians/Family Doctors [facility] ([facility]): [address], [phone] [facility]: [address], [phone] [facility]: [address], [phone]  [place] [facility]  [facility] ([facility]): [phone] [facility]: [address], [phone]   [place] Family Doctors [facility]: [address], [phone]  [place] [facility]  [facility]: [address], [phone]  Home Blood Pressure Monitoring for Patients   Your provider has recommended that you check your   blood pressure (BP) at least once a week at home. If you do not have a blood pressure cuff at home, one will be provided for you. Contact your provider if you have not received your monitor within 1 week.   Helpful Tips for Accurate Home Blood Pressure Checks  Don't smoke, exercise, or drink caffeine 30 minutes before checking your BP Use the restroom before checking your BP (a full bladder can raise your  pressure) Relax in a comfortable upright chair Feet on the ground Left arm resting comfortably on a flat surface at the level of your heart Legs uncrossed Back supported Sit quietly and don't talk Place the cuff on your bare arm Adjust snuggly, so that only two fingertips can fit between your skin and the top of the cuff Check 2 readings separated by at least one minute Keep a log of your BP readings For a visual, please reference this diagram: http://ccnc.care/bpdiagram  Provider Name: [facility]     Phone: [phone]  Zone 1: ALL CLEAR  Continue to monitor your symptoms:  BP reading is less than 140 (top number) or less than 90 (bottom number)  No right upper stomach pain No headaches or seeing spots No feeling nauseated or throwing up No swelling in face and hands  Zone 2: CAUTION Call your doctor's office for any of the following:  BP reading is greater than 140 (top number) or greater than 90 (bottom number)  Stomach pain under your ribs in the middle or right side Headaches or seeing spots Feeling nauseated or throwing up Swelling in face and hands  Zone 3: EMERGENCY  Seek immediate medical care if you have any of the following:  BP reading is greater than160 (top number) or greater than 110 (bottom number) Severe headaches not improving with Tylenol Serious difficulty catching your breath Any worsening symptoms from Zone 2   Third Trimester of Pregnancy The third trimester is from week 29 through week 42, months 7 through 9. The third trimester is a time when the fetus is growing rapidly. At the end of the ninth month, the fetus is about 20 inches in length and weighs 6-10 pounds.  BODY CHANGES Your body goes through many changes during pregnancy. The changes vary from woman to woman.  Your weight will continue to increase. You can expect to gain 25-35 pounds (11-16 kg) by the end of the pregnancy. You may begin to get stretch marks on your hips, abdomen,  and breasts. You may urinate more often because the fetus is moving lower into your pelvis and pressing on your bladder. You may develop or continue to have heartburn as a result of your pregnancy. You may develop constipation because certain hormones are causing the muscles that push waste through your intestines to slow down. You may develop hemorrhoids or swollen, bulging veins (varicose veins). You may have pelvic pain because of the weight gain and pregnancy hormones relaxing your joints between the bones in your pelvis. Backaches may result from overexertion of the muscles supporting your posture. You may have changes in your hair. These can include thickening of your hair, rapid growth, and changes in texture. Some women also have hair loss during or after pregnancy, or hair that feels dry or thin. Your hair will most likely return to normal after your baby is born. Your breasts will continue to grow and be tender. A yellow discharge may leak from your breasts called colostrum. Your belly button may stick out. You may   feel short of breath because of your expanding uterus. You may notice the fetus "dropping," or moving lower in your abdomen. You may have a bloody mucus discharge. This usually occurs a few days to a week before labor begins. Your cervix becomes thin and soft (effaced) near your due date. WHAT TO EXPECT AT YOUR PRENATAL EXAMS  You will have prenatal exams every 2 weeks until week 36. Then, you will have weekly prenatal exams. During a routine prenatal visit: You will be weighed to make sure you and the fetus are growing normally. Your blood pressure is taken. Your abdomen will be measured to track your baby's growth. The fetal heartbeat will be listened to. Any test results from the previous visit will be discussed. You may have a cervical check near your due date to see if you have effaced. At around 36 weeks, your caregiver will check your cervix. At the same time, your  caregiver will also perform a test on the secretions of the vaginal tissue. This test is to determine if a type of bacteria, Group B streptococcus, is present. Your caregiver will explain this further. Your caregiver may ask you: What your birth plan is. How you are feeling. If you are feeling the baby move. If you have had any abnormal symptoms, such as leaking fluid, bleeding, severe headaches, or abdominal cramping. If you have any questions. Other tests or screenings that may be performed during your third trimester include: Blood tests that check for low iron levels (anemia). Fetal testing to check the health, activity level, and growth of the fetus. Testing is done if you have certain medical conditions or if there are problems during the pregnancy. FALSE LABOR You may feel small, irregular contractions that eventually go away. These are called Braxton Hicks contractions, or false labor. Contractions may last for hours, days, or even weeks before true labor sets in. If contractions come at regular intervals, intensify, or become painful, it is best to be seen by your caregiver.  SIGNS OF LABOR  Menstrual-like cramps. Contractions that are 5 minutes apart or less. Contractions that start on the top of the uterus and spread down to the lower abdomen and back. A sense of increased pelvic pressure or back pain. A watery or bloody mucus discharge that comes from the vagina. If you have any of these signs before the 37th week of pregnancy, call your caregiver right away. You need to go to the hospital to get checked immediately. HOME CARE INSTRUCTIONS  Avoid all smoking, herbs, alcohol, and unprescribed drugs. These chemicals affect the formation and growth of the baby. Follow your caregiver's instructions regarding medicine use. There are medicines that are either safe or unsafe to take during pregnancy. Exercise only as directed by your caregiver. Experiencing uterine cramps is a good sign to  stop exercising. Continue to eat regular, healthy meals. Wear a good support bra for breast tenderness. Do not use hot tubs, steam rooms, or saunas. Wear your seat belt at all times when driving. Avoid raw meat, uncooked cheese, cat litter boxes, and soil used by cats. These carry germs that can cause birth defects in the baby. Take your prenatal vitamins. Try taking a stool softener (if your caregiver approves) if you develop constipation. Eat more high-fiber foods, such as fresh vegetables or fruit and whole grains. Drink plenty of fluids to keep your urine clear or pale yellow. Take warm sitz baths to soothe any pain or discomfort caused by hemorrhoids. Use hemorrhoid cream if   your caregiver approves. If you develop varicose veins, wear support hose. Elevate your feet for 15 minutes, 3-4 times a day. Limit salt in your diet. Avoid heavy lifting, wear low heal shoes, and practice good posture. Rest a lot with your legs elevated if you have leg cramps or low back pain. Visit your dentist if you have not gone during your pregnancy. Use a soft toothbrush to brush your teeth and be gentle when you floss. A sexual relationship may be continued unless your caregiver directs you otherwise. Do not travel far distances unless it is absolutely necessary and only with the approval of your caregiver. Take prenatal classes to understand, practice, and ask questions about the labor and delivery. Make a trial run to the hospital. Pack your hospital bag. Prepare the baby's nursery. Continue to go to all your prenatal visits as directed by your caregiver. SEEK MEDICAL CARE IF: You are unsure if you are in labor or if your water has broken. You have dizziness. You have mild pelvic cramps, pelvic pressure, or nagging pain in your abdominal area. You have persistent nausea, vomiting, or diarrhea. You have a bad smelling vaginal discharge. You have pain with urination. SEEK IMMEDIATE MEDICAL CARE IF:  You  have a fever. You are leaking fluid from your vagina. You have spotting or bleeding from your vagina. You have severe abdominal cramping or pain. You have rapid weight loss or gain. You have shortness of breath with chest pain. You notice sudden or extreme swelling of your face, hands, ankles, feet, or legs. You have not felt your baby move in over an hour. You have severe headaches that do not go away with medicine. You have vision changes. Document Released: 04/18/2001 Document Revised: 04/29/2013 Document Reviewed: 06/25/2012 ExitCare Patient Information 2015 [employer]. This information is not intended to replace advice given to you by your health care provider. Make sure you discuss any questions you have with your health care provider.       

## 2022-08-11 NOTE — Progress Notes (Signed)
HIGH-RISK PREGNANCY VISIT Patient name: Katherine [name] MRN [id number]  Date of birth: 1991/02/15 Chief Complaint:   Routine Prenatal Visit  History of Present Illness:   BETUL MUDRAK is a 32 y.o. G43P1011 female at [redacted]w[redacted]d with an Estimated Date of Delivery: 10/16/22 being seen today for ongoing management of a high-risk pregnancy complicated by diabetes mellitus A1DM.    Today she reports  feeling low abd pressure when her bladder is full, then it resolves . Contractions: Not present.  .  Movement: Present. denies leaking of fluid.      07/21/2022    8:30 AM 04/06/2022    3:17 PM 06/04/2020   11:16 AM 08/01/2019   10:03 AM 12/11/2016    2:38 PM  Depression screen PHQ 2/9  Decreased Interest 0 0 0 0 1  Down, Depressed, Hopeless 0 0 0 0 0  PHQ - 2 Score 0 0 0 0 1  Altered sleeping 2 1 0  0  Tired, decreased energy 1 1 1  2   Change in appetite 0 0 0  1  Feeling bad or failure about yourself  0 0 0  0  Trouble concentrating 0 0 0  0  Moving slowly or fidgety/restless 0 0   0  Suicidal thoughts 0 0   0  PHQ-9 Score 3 2 1  4   Difficult doing work/chores Somewhat difficult  Not difficult at all          07/21/2022    8:31 AM 04/06/2022    3:17 PM 06/04/2020   11:17 AM  GAD 7 : Generalized Anxiety Score  Nervous, Anxious, on Edge 0 1 1  Control/stop worrying 0 0 2  Worry too much - different things 1 0 2  Trouble relaxing 1 1 1   Restless 1 0 1  Easily annoyed or irritable 1 2 1   Afraid - awful might happen 0 0 0  Total GAD 7 Score 4 4 8   Anxiety Difficulty Not difficult at all  Not difficult at all     Review of Systems:   Pertinent items are noted in HPI Denies abnormal vaginal discharge w/ itching/odor/irritation, headaches, visual changes, shortness of breath, chest pain, abdominal pain, severe nausea/vomiting, or problems with urination or bowel movements unless otherwise stated above. Pertinent History Reviewed:  Reviewed past medical,surgical, social, obstetrical and family  history.  Reviewed problem list, medications and allergies. Physical Assessment:   Vitals:   08/11/22 0942  BP: 105/66  Pulse: 88  Weight: 184 lb 6.4 oz (83.6 kg)  Body mass index is 32.66 kg/m.           Physical Examination:   General appearance: alert, well appearing, and in no distress  Mental status: alert, oriented to person, place, and time  Skin: warm & dry   Extremities: Edema: None    Cardiovascular: normal heart rate noted  Respiratory: normal respiratory effort, no distress  Abdomen: gravid, soft, non-tender  Pelvic: Cervical exam deferred         Fetal Status: Fetal Heart Rate (bpm): 135 Fundal Height: 29 cm Movement: Present    Fetal Surveillance Testing today: doppler    No results found for this or any previous visit (from the past 24 hour(s)).  Assessment & Plan:  High-risk pregnancy: G3P1011 at [redacted]w[redacted]d with an Estimated Date of Delivery: 10/16/22   1) GDMA1, fastings all <95; had 4 PP values that were higher all with identified dietary indiscretion (140-150, one 176); has education appt on  4/16; she will really focus on healthy food choices and will start daily walking x 15-5620mins, and is hopeful that those things will keep her from starting meds; growth with next visit   Meds: No orders of the defined types were placed in this encounter.   Labs/procedures today: none  Treatment Plan:  growth with next visit and then in 4wks  Reviewed: Preterm labor symptoms and general obstetric precautions including but not limited to vaginal bleeding, contractions, leaking of fluid and fetal movement were reviewed in detail with the patient.  All questions were answered. Does have home bp cuff. Office bp cuff given: not applicable. Check bp weekly, let us know if consistently >140 and/or >90.  Follow-up: Return in about 2 weeks (around 08/25/2022) for HROB, US: EFW, in person.   Future Appointments  Date Time Provider Department Center  08/11/2022 10:30 AM Arabella MerlesShaw, Aveon Colquhoun  D, CNM [facility]  08/22/2022  9:30 AM Mare Loanrumpton, Mary D, RD [facility] None  08/25/2022  8:50 AM Marny LowensteinWenzel, Julie N, PA-C [facility]  09/01/2022  8:45 AM Desenglau, Shireen QuanJacqueline L, PT [facility] None  09/11/2022  3:50 PM Cheral MarkerBooker, Alsace Dowd R, CNM [facility]  09/25/2022  4:10 PM Cheral MarkerBooker, Elaya Droege R, CNM [facility]  09/29/2022  8:45 AM Desenglau, Shireen QuanJacqueline L, PT [facility] None    Orders Placed This Encounter  Procedures   US OB Follow Up   Arabella MerlesKimberly D Rollan [facility] 08/11/2022 10:11 AM

## 2022-08-17 ENCOUNTER — Other Ambulatory Visit: Payer: Self-pay | Admitting: Adult Health

## 2022-08-21 ENCOUNTER — Encounter: Payer: Self-pay | Admitting: Advanced Practice Midwife

## 2022-08-21 ENCOUNTER — Ambulatory Visit (INDEPENDENT_AMBULATORY_CARE_PROVIDER_SITE_OTHER): Payer: Medicaid Other | Admitting: Advanced Practice Midwife

## 2022-08-21 ENCOUNTER — Ambulatory Visit (INDEPENDENT_AMBULATORY_CARE_PROVIDER_SITE_OTHER): Payer: Medicaid Other

## 2022-08-21 ENCOUNTER — Encounter: Payer: Medicaid Other | Attending: Women's Health | Admitting: Nutrition

## 2022-08-21 VITALS — BP 93/61 | HR 83 | Wt 182.0 lb

## 2022-08-21 DIAGNOSIS — Z3A32 32 weeks gestation of pregnancy: Secondary | ICD-10-CM

## 2022-08-21 DIAGNOSIS — O24410 Gestational diabetes mellitus in pregnancy, diet controlled: Secondary | ICD-10-CM

## 2022-08-21 DIAGNOSIS — O0993 Supervision of high risk pregnancy, unspecified, third trimester: Secondary | ICD-10-CM

## 2022-08-21 DIAGNOSIS — Z148 Genetic carrier of other disease: Secondary | ICD-10-CM

## 2022-08-21 NOTE — Patient Instructions (Signed)
Katherine [name], I greatly value your feedback.  If you receive a survey following your visit with [place] today, we appreciate you taking the time to fill it out.  Thanks, Katherine Beams, DNP, CNM  [facility] HAS MOVED!!! It is now [facility] ([address] [place], [place] [address]) Entrance located off of [address] Free 24/7 valet parking   Go to [email or web address] to register for FREE online childbirth classes    Call the office [phone]) or go to [facility] if: You begin to have strong, frequent contractions Your water breaks.  Sometimes it is a big gush of fluid, sometimes it is just a trickle that keeps getting your panties wet or running down your legs You have vaginal bleeding.  It is normal to have a small amount of spotting if your cervix was checked.  You don't feel your baby moving like normal.  If you don't, get you something to eat and drink and lay down and focus on feeling your baby move.  You should feel at least 10 movements in 2 hours.  If you don't, you should call the office or go to [facility].   Home Blood Pressure Monitoring for Patients   Your provider has recommended that you check your blood pressure (BP) at least once a week at home. If you do not have a blood pressure cuff at home, one will be provided for you. Contact your provider if you have not received your monitor within 1 week.   Helpful Tips for Accurate Home Blood Pressure Checks  Don't smoke, exercise, or drink caffeine 30 minutes before checking your BP Use the restroom before checking your BP (a full bladder can raise your pressure) Relax in a comfortable upright chair Feet on the ground Left arm resting comfortably on a flat surface at the level of your heart Legs uncrossed Back supported Sit quietly and don't talk Place the cuff on your bare arm Adjust snuggly, so that only two fingertips can fit between your skin and the top of  the cuff Check 2 readings separated by at least one minute Keep a log of your BP readings For a visual, please reference this diagram: http://ccnc.care/bpdiagram  Provider Name: [facility]     Phone: [phone]  Zone 1: ALL CLEAR  Continue to monitor your symptoms:  BP reading is less than 140 (top number) or less than 90 (bottom number)  No right upper stomach pain No headaches or seeing spots No feeling nauseated or throwing up No swelling in face and hands  Zone 2: CAUTION Call your doctor's office for any of the following:  BP reading is greater than 140 (top number) or greater than 90 (bottom number)  Stomach pain under your ribs in the middle or right side Headaches or seeing spots Feeling nauseated or throwing up Swelling in face and hands  Zone 3: EMERGENCY  Seek immediate medical care if you have any of the following:  BP reading is greater than160 (top number) or greater than 110 (bottom number) Severe headaches not improving with Tylenol Serious difficulty catching your breath Any worsening symptoms from Zone 2

## 2022-08-21 NOTE — Progress Notes (Signed)
HIGH-RISK PREGNANCY VISIT Patient name: Katherine [name] MRN [id number]  Date of birth: Apr 19, 1991 Chief Complaint:   Routine Prenatal Visit  History of Present Illness:   Katherine [name] is a 32 y.o. G47P1011 female at [redacted]w[redacted]d with an Estimated Date of Delivery: 10/16/22 being seen today for ongoing management of a high-risk pregnancy complicated by diabetes mellitus A1DM.    Today she reports no complaints. Contractions: Not present. Vag. Bleeding: None.  Movement: Present. denies leaking of fluid.      07/21/2022    8:30 AM 04/06/2022    3:17 PM 06/04/2020   11:16 AM 08/01/2019   10:03 AM 12/11/2016    2:38 PM  Depression screen PHQ 2/9  Decreased Interest 0 0 0 0 1  Down, Depressed, Hopeless 0 0 0 0 0  PHQ - 2 Score 0 0 0 0 1  Altered sleeping 2 1 0  0  Tired, decreased energy Change in appetite 0 0 0  1  Feeling bad or failure about yourself  0 0 0  0  Trouble concentrating 0 0 0  0  Moving slowly or fidgety/restless 0 0   0  Suicidal thoughts 0 0   0  PHQ-9 Score Difficult doing work/chores Somewhat difficult  Not difficult at all          07/21/2022    8:31 AM 04/06/2022    3:17 PM 06/04/2020   11:17 AM  GAD 7 : Generalized Anxiety Score  Nervous, Anxious, on Edge 0 1 1  Control/stop worrying 0 0 2  Worry too much - different things 1 0 2  Trouble relaxing Restless 1 0 1  Easily annoyed or irritable Afraid - awful might happen 0 0 0  Total GAD 7 Score Anxiety Difficulty Not difficult at all  Not difficult at all     Review of Systems:   Pertinent items are noted in HPI Denies abnormal vaginal discharge w/ itching/odor/irritation, headaches, visual changes, shortness of breath, chest pain, abdominal pain, severe nausea/vomiting, or problems with urination or bowel movements unless otherwise stated above. Pertinent History Reviewed:  Reviewed past medical,surgical, social, obstetrical and family history.  Reviewed problem list,  medications and allergies. Physical Assessment:   Vitals:   08/21/22 1216  BP: 93/61  Pulse: 83  Weight: 182 lb (82.6 kg)  Body mass index is 32.24 kg/m.           Physical Examination:   General appearance: alert, well appearing, and in no distress  Mental status: alert, oriented to person, place, and time  Skin: warm & dry   Extremities: Edema: None    Cardiovascular: normal heart rate noted  Respiratory: normal respiratory effort, no distress  Abdomen: gravid, soft, non-tender  Pelvic: Cervical exam deferred         Fetal Status:     Movement: Present    Fetal Surveillance Testing today: [place] 32 wks,cephalic,anterior placenta gr 2,AFI 12 cm,FHR 126 bpm,EFW 1880 g 39%    Chaperone: N/A    No results found for this or any previous visit (from the past 24 hour(s)).  Assessment & Plan:  High-risk pregnancy: G3P1011 at [redacted]w[redacted]d with an Estimated Date of Delivery: 10/16/22   1. Encounter for supervision of high risk pregnancy in third trimester, antepartum   2. Diet controlled gestational diabetes mellitus (GDM) in third trimester  Has dietician appt this week. BS mostly within range, only elevated if she eats a lot of carbs    Meds: No orders of the defined types were placed in this encounter.   Orders: No orders of the defined types were placed in this encounter.    Labs/procedures today: none   Reviewed: Preterm labor symptoms and general obstetric precautions including but not limited to vaginal bleeding, contractions, leaking of fluid and fetal movement were reviewed in detail with the patient.  All questions were answered. Does have home bp cuff. Office bp cuff given: not applicable. Check bp weekly, let us know if consistently >140 and/or >90.  Follow-up: Return in about 2 weeks (around 09/04/2022) for HROB w/CNM or MD, add growth [place] to HROB appt in 4 weeks (could be 5 weeks).   Future Appointments  Date Time Provider Department Center  08/21/2022  4:30 PM Mare Loan, RD [facility] None  09/01/2022  8:45 AM Desenglau, Shireen Quan, PT [facility] None  09/11/2022  3:50 PM Cheral Marker, CNM [facility]  09/25/2022  4:10 PM Cheral Marker, CNM [facility]  09/29/2022  8:45 AM Desenglau, Shireen Quan, PT [facility] None    No orders of the defined types were placed in this encounter.  Jacklyn Shell , DNP, CNM [facility] 08/21/2022 1:56 PM

## 2022-08-21 NOTE — Progress Notes (Unsigned)
  Patient was seen on 08-21-22 for Gestational Diabetes self-management at the [facility] in [place], [place]. The following learning objectives were met by the patient during this course:  States the definition of Gestational Diabetes States why dietary management is important in controlling blood glucose Describes the effects each nutrient has on blood glucose levels Demonstrates ability to create a balanced meal plan Demonstrates carbohydrate counting  States when to check blood glucose levels Demonstrates proper blood glucose monitoring techniques States the effect of stress and exercise on blood glucose levels States the importance of limiting caffeine and abstaining from alcohol and smoking    Patient instructed to monitor glucose levels: FBS: 60 - <90 1 hour: <140 2 hour: <120  *Patient received handouts: Nutrition Diabetes and Pregnancy Carbohydrate Counting List  Patient will be seen for follow-up as needed.

## 2022-08-21 NOTE — Progress Notes (Signed)
[place] 32 wks,cephalic,anterior placenta gr 2,AFI 12 cm,FHR 126 bpm,EFW 1880 g 39%

## 2022-08-22 ENCOUNTER — Ambulatory Visit: Payer: Medicaid Other | Admitting: Nutrition

## 2022-08-22 NOTE — Patient Instructions (Signed)
  Patient instructed to monitor glucose levels: FBS: 60 - <90 1 hour: <140 2 hour: <120 

## 2022-08-25 ENCOUNTER — Encounter: Payer: Medicaid Other | Admitting: Medical

## 2022-08-28 ENCOUNTER — Encounter: Payer: Medicaid Other | Admitting: Nutrition

## 2022-09-01 ENCOUNTER — Ambulatory Visit: Payer: Medicaid Other | Attending: Family Medicine | Admitting: Physical Therapy

## 2022-09-01 ENCOUNTER — Encounter: Payer: Self-pay | Admitting: Physical Therapy

## 2022-09-01 DIAGNOSIS — R279 Unspecified lack of coordination: Secondary | ICD-10-CM | POA: Diagnosis present

## 2022-09-01 DIAGNOSIS — R293 Abnormal posture: Secondary | ICD-10-CM | POA: Diagnosis present

## 2022-09-01 DIAGNOSIS — M6281 Muscle weakness (generalized): Secondary | ICD-10-CM | POA: Diagnosis present

## 2022-09-01 NOTE — Therapy (Signed)
OUTPATIENT PHYSICAL THERAPY FEMALE PELVIC TREATMENT   Patient Name: Katherine [name] MRN: [id number] DOB:[birth year], 32 y.o., female Today's Date: 09/01/2022  END OF SESSION:  PT End of Session - 09/01/22 0843     Visit Number 2    Date for PT Re-Evaluation 09/15/22    Authorization Type healthy blue - [employer] Approved 4 visits,-09/01/2022-09/30/2022-Auth#[id number]    PT Start Time 0844    PT Stop Time 0922    PT Time Calculation (min) 38 min    Activity Tolerance Patient tolerated treatment well    Behavior During Therapy [facility] for tasks assessed/performed              Past Medical History:  Diagnosis Date   Bacterial vaginosis    Secondary oligomenorrhea    Past Surgical History:  Procedure Laterality Date   TONSILLECTOMY     Patient Active Problem List   Diagnosis Date Noted   Gestational diabetes 07/24/2022   Carrier of spinal muscular atrophy 07/21/2022   Encounter for supervision of high risk pregnancy in third trimester, antepartum 04/06/2022   Secondary oligomenorrhea 04/06/2022   Excess body and facial hair 08/01/2019    PCP: [facility], [facility]   REFERRING PROVIDER: Jacklyn Shell, CNM   REFERRING DIAG: N39.3 (ICD-10-CM) - Stress incontinence   THERAPY DIAG:  Unspecified lack of coordination  Muscle weakness (generalized)  Abnormal posture  Rationale for Evaluation and Treatment: Rehabilitation  ONSET DATE: year ago  SUBJECTIVE:                                                                                                                                                                                           SUBJECTIVE STATEMENT: [redacted] weeks gestation I have a quarter size amount every time I use the bathroom.  A couple times coughing and laughing really hard.  If I throw up it will definitely leak also.   Fluid intake: Yes: a lot of water    PAIN:  Are you having pain? Yes NPRS scale: 2-3/10 Pain location:  round  ligaments  Pain type: dull Pain description: intermittent   Aggravating factors: stretching my abs or turn too quickly I feel round ligament pain Relieving factors:   PRECAUTIONS: None  WEIGHT BEARING RESTRICTIONS: No  FALLS:  Has patient fallen in last 6 months? No  LIVING ENVIRONMENT: Lives with: lives with their family, lives with their spouse, and brother and son Lives in: House/apartment   OCCUPATION: reception  PLOF: Independent  PATIENT GOALS: not have leakage  PERTINENT HISTORY:  Pregnant, previous pregnancy Sexual abuse: No  BOWEL MOVEMENT: Pain with bowel movement: No Type of bowel movement:Strain  Yes sometimes, constipation during pregnancy sometimes Fully empty rectum: Yes:   Leakage: No Pads: No Fiber supplement:   URINATION: Pain with urination: No Fully empty bladder: No Stream: Strong Urgency: No Frequency: every 2-3 hours Leakage: Coughing, Laughing, and throwing Pads: No  INTERCOURSE: Pain with intercourse:  No   PREGNANCY: Vaginal deliveries 1 Tearing No   PROLAPSE: None   OBJECTIVE:   DIAGNOSTIC FINDINGS:    PATIENT SURVEYS:    PFIQ-7 = 48 for bladder  COGNITION: Overall cognitive status: Within functional limits for tasks assessed     SENSATION: Light touch: Appears intact Proprioception: Appears intact  MUSCLE LENGTH: Hamstrings: WFL based on fwd flexion LUMBAR SPECIAL TESTS:  ASLR - Rt LE easier with compression for transversus abdominus assist, less stability in Rt side without compression  FUNCTIONAL TESTS:  Single leg normal with   GAIT:  Comments: WFL   POSTURE: rounded shoulders, increased lumbar lordosis, increased thoracic kyphosis, and anterior pelvic tilt  PELVIC ALIGNMENT: normal  LUMBARAROM/PROM:  A/PROM A/PROM  eval  Flexion   Extension   Right lateral flexion   Left lateral flexion   Right rotation   Left rotation    (Blank rows = not tested)  LOWER EXTREMITY ROM:  Passive  ROM Right eval Left eval  Hip flexion    Hip extension    Hip abduction    Hip adduction    Hip internal rotation    Hip external rotation    Knee flexion    Knee extension    Ankle dorsiflexion    Ankle plantarflexion    Ankle inversion    Ankle eversion     (Blank rows = not tested)  LOWER EXTREMITY MMT:  MMT Right eval Left eval  Hip flexion    Hip extension    Hip abduction    Hip adduction    Hip internal rotation    Hip external rotation    Knee flexion    Knee extension    Ankle dorsiflexion    Ankle plantarflexion    Ankle inversion    Ankle eversion     PALPATION:   General  lumbar and thoracic tight                External Perineal Exam normal                             Internal Pelvic Floor difficulty relaxing after contracting, low endurance  Patient confirms identification and approves PT to assess internal pelvic floor and treatment Yes  PELVIC MMT:   MMT eval  Vaginal 3/5 MMT, 10 reps, 2 sec hold  Internal Anal Sphincter   External Anal Sphincter   Puborectalis   Diastasis Recti   (Blank rows = not tested)        TONE: High Rt>Lt  PROLAPSE: no  TODAY'S TREATMENT:  DATE: 09/01/22  NMRE: Qudaruped - cat cow and UE reached Supine h/s stretch Supine breathing and engage core - block 3 ways, UE 90 deg stationary Puppy pose with rock and breathing hip IR/ER Sidelying hip IR block between knees  Theract: Educated on desk posture andhow to improve set up at work     PATIENT EDUCATION:  Education details: Access Code: [email or web address] and double voiding techniques Person educated: Patient Education method: Programmer, multimedia, Facilities manager, Verbal cues, and Handouts Education comprehension: verbalized understanding, returned demonstration, and needs further education  HOME EXERCISE PROGRAM: Access Code: [email or web address] URL:  [email or web address] Date: 09/01/2022 Prepared by: Dwana Curd  Exercises - Standing 'L' Stretch at Counter  - 1 x daily - 7 x weekly - 3 sets - 10 reps - Seated Thoracic Lumbar Extension  - 1 x daily - 7 x weekly - 1 sets - 10 reps - 5 sec hold - Supine Hamstring Stretch with Doorway  - 2 x daily - 7 x weekly - 1 sets - 3 reps - 20 sec hold - Quadruped Thoracic Spine Extension  - 1 x daily - 7 x weekly - 3 sets - 10 reps - Quadruped Alternating Arm Lift  - 1 x daily - 7 x weekly - 2 sets - 10 reps - Modified Cat Cow on Counter  - 1 x daily - 7 x weekly - 3 sets - 10 reps  ASSESSMENT:  CLINICAL IMPRESSION: Today's session focused on coordination of breathing and core work.  Pt did well with getting the correct coordination with minimal cues.  Pt was given exercises to progress with HEP.  She was able to begin basic core strength today and will benefit from continued work on more.    Pt will benefit from skilled PT to address coordination and strength along with all impairments mentioned above for improved function and quality of life.  OBJECTIVE IMPAIRMENTS: decreased coordination, decreased endurance, decreased ROM, decreased strength, increased muscle spasms, impaired tone, postural dysfunction, and pain.   ACTIVITY LIMITATIONS: sitting, continence, and toileting  PARTICIPATION LIMITATIONS: cleaning, driving, community activity, and occupation  PERSONAL FACTORS: Profession, Time since onset of injury/illness/exacerbation, and 1-2 comorbidities: pregnant, vaginal delivery  are also affecting patient's functional outcome.   REHAB POTENTIAL: Excellent  CLINICAL DECISION MAKING: Evolving/moderate complexity  EVALUATION COMPLEXITY: Moderate   GOALS: Goals reviewed with patient? Yes  SHORT TERM GOALS: Target date: 07/21/22 Updated 09/01/22  Pt will be ind with initial HEP Baseline: Goal status: MET  2.  Pt will have knowledge of improvements in sitting  posture for improved pressure management Baseline:  Goal status: IN PROGRESS    LONG TERM GOALS: Target date: 09/15/22  Pt will be independent with advanced HEP to maintain improvements made throughout therapy  Baseline:  Goal status: INITIAL  2.  Pt will not have leakage after voiding due to imporved muscle coordination Baseline:  Goal status: INITIAL  3.  Pt will have 30% less ligament pain due to improved core strength and posture Baseline:  Goal status: INITIAL  4.  Pt will be able to cough or if sick not have leakage Baseline:  Goal status: INITIAL   PLAN:  PT FREQUENCY: 1x/week  PT DURATION: 12 weeks  PLANNED INTERVENTIONS: Therapeutic exercises, Therapeutic activity, Neuromuscular re-education, Balance training, Gait training, Patient/Family education, Self Care, Joint mobilization, Aquatic Therapy, Dry Needling, Electrical stimulation, Cryotherapy, Moist heat, Taping, Ultrasound, Biofeedback, Manual therapy, and Re-evaluation  PLAN FOR NEXT SESSION: core strength, kegel with exhale, posture and pressure managemnt, f/u  on desk posture (something under feet and lift chair higher)   Brayton Caves May [place], PT 09/01/2022, 8:44 AM

## 2022-09-05 ENCOUNTER — Encounter: Payer: Self-pay | Admitting: Women's Health

## 2022-09-05 ENCOUNTER — Ambulatory Visit (INDEPENDENT_AMBULATORY_CARE_PROVIDER_SITE_OTHER): Payer: Medicaid Other | Admitting: Women's Health

## 2022-09-05 VITALS — BP 106/65 | HR 76 | Wt 184.0 lb

## 2022-09-05 DIAGNOSIS — Z331 Pregnant state, incidental: Secondary | ICD-10-CM

## 2022-09-05 DIAGNOSIS — O0993 Supervision of high risk pregnancy, unspecified, third trimester: Secondary | ICD-10-CM

## 2022-09-05 DIAGNOSIS — O24410 Gestational diabetes mellitus in pregnancy, diet controlled: Secondary | ICD-10-CM

## 2022-09-05 DIAGNOSIS — Z1389 Encounter for screening for other disorder: Secondary | ICD-10-CM

## 2022-09-05 DIAGNOSIS — Z3A34 34 weeks gestation of pregnancy: Secondary | ICD-10-CM

## 2022-09-05 LAB — POCT URINALYSIS DIPSTICK OB
Blood, UA: NEGATIVE
Glucose, UA: NEGATIVE
Ketones, UA: 40
Leukocytes, UA: NEGATIVE
Nitrite, UA: NEGATIVE
POC,PROTEIN,UA: NEGATIVE

## 2022-09-05 NOTE — Patient Instructions (Signed)
Indie, thank you for choosing our office today! We appreciate the opportunity to meet your healthcare needs. You may receive a short survey by mail, e-mail, or through MyChart. If you are happy with your care we would appreciate if you could take just a few minutes to complete the survey questions. We read all of your comments and take your feedback very seriously. Thank you again for choosing our office.  [facility] ([address] [place], [place] [address]) Entrance C, located off of [address] Free 24/7 valet parking   CLASSES: Go to [email or web address] to register for classes (childbirth, breastfeeding, waterbirth, infant CPR, daddy bootcamp, etc.)  Call the office ([phone]) or go to [facility] if: You begin to have strong, frequent contractions Your water breaks.  Sometimes it is a big gush of fluid, sometimes it is just a trickle that keeps getting your panties wet or running down your legs You have vaginal bleeding.  It is normal to have a small amount of spotting if your cervix was checked.  You don't feel your baby moving like normal.  If you don't, get you something to eat and drink and lay down and focus on feeling your baby move.   If your baby is still not moving like normal, you should call the office or go to [facility].  Call the office ([phone]) or go to [facility] for these signs of pre-eclampsia: Severe headache that does not go away with Tylenol Visual changes- seeing spots, double, blurred vision Pain under your right breast or upper abdomen that does not go away with Tums or heartburn medicine Nausea and/or vomiting Severe swelling in your hands, feet, and face   Tdap Vaccine It is recommended that you get the Tdap vaccine during the third trimester of EACH pregnancy to help protect your baby from getting pertussis (whooping cough) 27-36 weeks is the BEST time to do  this so that you can pass the protection on to your baby. During pregnancy is better than after pregnancy, but if you are unable to get it during pregnancy it will be offered at the hospital.  You can get this vaccine with us, at the health department, your family doctor, or some local pharmacies Everyone who will be around your baby should also be up-to-date on their vaccines before the baby comes. Adults (who are not pregnant) only need 1 dose of Tdap during adulthood.   [place] Pediatricians/Family Doctors [facility] ([facility]): [address], [phone]           [facility]: [address], [phone]                [facility] ([facility]): [address], [phone] (call to ask if accepting patients) [facility]: [address], [place], [phone]    [place] Pediatricians/Family Doctors [facility] ([facility]): [address], [phone] [facility]: [address], [phone] [facility]: [address], [phone]  [place] [facility]  [facility] ([facility]): [phone] [facility]: [address], [phone]   [place] Family Doctors [facility]: [address], [phone]  [place] [facility]  [facility]: [address], [phone]  Home Blood Pressure Monitoring for Patients   Your provider has recommended that you check your   blood pressure (BP) at least once a week at home. If you do not have a blood pressure cuff at home, one will be provided for you. Contact your provider if you have not received your monitor within 1 week.   Helpful Tips for Accurate Home Blood Pressure Checks  Don't smoke, exercise, or drink caffeine 30 minutes before checking your BP Use the restroom before checking your BP (a full bladder can raise your  pressure) Relax in a comfortable upright chair Feet on the ground Left arm resting comfortably on a flat surface at the level of your heart Legs uncrossed Back supported Sit quietly and don't talk Place the cuff on your bare arm Adjust snuggly, so that only two fingertips can fit between your skin and the top of the cuff Check 2 readings separated by at least one minute Keep a log of your BP readings For a visual, please reference this diagram: [email or web address].care/bpdiagram  Provider Name: [facility]     Phone: [phone]  Zone 1: ALL CLEAR  Continue to monitor your symptoms:  BP reading is less than 140 (top number) or less than 90 (bottom number)  No right upper stomach pain No headaches or seeing spots No feeling nauseated or throwing up No swelling in face and hands  Zone 2: CAUTION Call your doctor's office for any of the following:  BP reading is greater than 140 (top number) or greater than 90 (bottom number)  Stomach pain under your ribs in the middle or right side Headaches or seeing spots Feeling nauseated or throwing up Swelling in face and hands  Zone 3: EMERGENCY  Seek immediate medical care if you have any of the following:  BP reading is greater than160 (top number) or greater than 110 (bottom number) Severe headaches not improving with Tylenol Serious difficulty catching your breath Any worsening symptoms from Zone 2  Preterm Labor and Birth Information  The normal length of a pregnancy is 39-41 weeks. Preterm labor is when labor starts before 37 completed weeks of pregnancy. What are the risk factors for preterm labor? Preterm labor is more likely to occur in women who: Have certain infections during pregnancy such as a bladder infection, sexually transmitted infection, or infection inside the uterus (chorioamnionitis). Have a shorter-than-normal cervix. Have gone into preterm labor before. Have had surgery on their cervix. Are younger than age 17  or older than age 35. Are African American. Are pregnant with twins or multiple babies (multiple gestation). Take street drugs or smoke while pregnant. Do not gain enough weight while pregnant. Became pregnant shortly after having been pregnant. What are the symptoms of preterm labor? Symptoms of preterm labor include: Cramps similar to those that can happen during a menstrual period. The cramps may happen with diarrhea. Pain in the abdomen or lower back. Regular uterine contractions that may feel like tightening of the abdomen. A feeling of increased pressure in the pelvis. Increased watery or bloody mucus discharge from the vagina. Water breaking (ruptured amniotic sac). Why is it important to recognize signs of preterm labor? It is important to recognize signs of preterm labor because babies who are born prematurely may not be fully developed. This can put them at an increased risk for: Long-term (chronic) heart and lung problems. Difficulty immediately after birth with regulating body systems, including blood sugar, body temperature, heart rate, and breathing rate. Bleeding in the brain. Cerebral palsy. Learning difficulties. Death. These risks are highest for babies who are born before 34 weeks   of pregnancy. How is preterm labor treated? Treatment depends on the length of your pregnancy, your condition, and the health of your baby. It may involve: Having a stitch (suture) placed in your cervix to prevent your cervix from opening too early (cerclage). Taking or being given medicines, such as: Hormone medicines. These may be given early in pregnancy to help support the pregnancy. Medicine to stop contractions. Medicines to help mature the baby's lungs. These may be prescribed if the risk of delivery is high. Medicines to prevent your baby from developing cerebral palsy. If the labor happens before 34 weeks of pregnancy, you may need to stay in the hospital. What should I do if I  think I am in preterm labor? If you think that you are going into preterm labor, call your health care provider right away. How can I prevent preterm labor in future pregnancies? To increase your chance of having a full-term pregnancy: Do not use any tobacco products, such as cigarettes, chewing tobacco, and e-cigarettes. If you need help quitting, ask your health care provider. Do not use street drugs or medicines that have not been prescribed to you during your pregnancy. Talk with your health care provider before taking any herbal supplements, even if you have been taking them regularly. Make sure you gain a healthy amount of weight during your pregnancy. Watch for infection. If you think that you might have an infection, get it checked right away. Make sure to tell your health care provider if you have gone into preterm labor before. This information is not intended to replace advice given to you by your health care provider. Make sure you discuss any questions you have with your health care provider. Document Revised: 08/16/2018 Document Reviewed: 09/15/2015 Elsevier Patient Education  2020 [employer]   

## 2022-09-05 NOTE — Progress Notes (Signed)
HIGH-RISK PREGNANCY VISIT Patient name: Katherine [name] MRN [id number]  Date of birth: [birth year] Chief Complaint:   Routine Prenatal Visit  History of Present Illness:   Katherine [name] is a 32 y.o. G13P1011 female at [redacted]w[redacted]d with an Estimated Date of Delivery: 10/16/22 being seen today for ongoing management of a high-risk pregnancy complicated by diabetes mellitus A1DM.    Today she reports  some low back pain,urinary frequency entire pregnancy- not worse lately, no discomfort. FBS all wnl, 2hr pp 82-146 (6>120, mainly dependent on food choices-still working on finding what does/doesn't affect sugars-just met w/ dietician 4/15) . Contractions: Not present.  .  Movement: Present. denies leaking of fluid.      07/21/2022    8:30 AM 04/06/2022    3:17 PM 06/04/2020   11:16 AM 08/01/2019   10:03 AM 12/11/2016    2:38 PM  Depression screen PHQ 2/9  Decreased Interest 0 0 0 0 1  Down, Depressed, Hopeless 0 0 0 0 0  PHQ - 2 Score 0 0 0 0 1  Altered sleeping 2 1 0  0  Tired, decreased energy 1 1 1  2   Change in appetite 0 0 0  1  Feeling bad or failure about yourself  0 0 0  0  Trouble concentrating 0 0 0  0  Moving slowly or fidgety/restless 0 0   0  Suicidal thoughts 0 0   0  PHQ-9 Score 3 2 1  4   Difficult doing work/chores Somewhat difficult  Not difficult at all          07/21/2022    8:31 AM 04/06/2022    3:17 PM 06/04/2020   11:17 AM  GAD 7 : Generalized Anxiety Score  Nervous, Anxious, on Edge 0 1 1  Control/stop worrying 0 0 2  Worry too much - different things 1 0 2  Trouble relaxing 1 1 1   Restless 1 0 1  Easily annoyed or irritable 1 2 1   Afraid - awful might happen 0 0 0  Total GAD 7 Score 4 4 8   Anxiety Difficulty Not difficult at all  Not difficult at all     Review of Systems:   Pertinent items are noted in HPI Denies abnormal vaginal discharge w/ itching/odor/irritation, headaches, visual changes, shortness of breath, chest pain, abdominal pain, severe nausea/vomiting,  or problems with urination or bowel movements unless otherwise stated above. Pertinent History Reviewed:  Reviewed past medical,surgical, social, obstetrical and family history.  Reviewed problem list, medications and allergies. Physical Assessment:   Vitals:   09/05/22 1553  BP: 106/65  Pulse: 76  Weight: 184 lb (83.5 kg)  Body mass index is 32.59 kg/m.           Physical Examination:   General appearance: alert, well appearing, and in no distress  Mental status: alert, oriented to person, place, and time  Skin: warm & dry   Extremities: Edema: None    Cardiovascular: normal heart rate noted  Respiratory: normal respiratory effort, no distress  Abdomen: gravid, soft, non-tender  Pelvic: Cervical exam deferred         Fetal Status: Fetal Heart Rate (bpm): 130 Fundal Height: 32 cm Movement: Present    Fetal Surveillance Testing today: doppler   Chaperone: N/A    Results for orders placed or performed in visit on 09/05/22 (from the past 24 hour(s))  POC Urinalysis Dipstick OB   Collection Time: 09/05/22  4:39 PM  Result Value Ref  Range   Color, UA     Clarity, UA     Glucose, UA Negative Negative   Bilirubin, UA     Ketones, UA 40    Spec Grav, UA     Blood, UA negative    pH, UA     POC,PROTEIN,UA Negative Negative, Trace, Small (1+), Moderate (2+), Large (3+), 4+   Urobilinogen, UA     Nitrite, UA negative    Leukocytes, UA Negative Negative   Appearance     Odor      Assessment & Plan:  High-risk pregnancy: G3P1011 at [redacted]w[redacted]d with an Estimated Date of Delivery: 10/16/22   1) A1DM, stable, continue working on food choices  2) Urinary frequency, no other sx, has been entire pregnancy, no nitrites on urine  3) Low back pain> discussed relief measures  Meds: No orders of the defined types were placed in this encounter.  Labs/procedures today: none  Treatment Plan:  [place] 36wks    No antenatal testing as long as remains off meds     Deliver @ 39-40wks:____    Reviewed: Preterm labor symptoms and general obstetric precautions including but not limited to vaginal bleeding, contractions, leaking of fluid and fetal movement were reviewed in detail with the patient.  All questions were answered. Does have home bp cuff. Office bp cuff given: not applicable. Check bp weekly, let us know if consistently >140 and/or >90.  Follow-up: Return for move efw u/s to 5/13 or 5/14 and add HROB w/ MD/CNM.   Future Appointments  Date Time Provider Department Center  09/18/2022  2:45 PM [facility] [place] [facility] None  09/18/2022  3:30 PM Cheral Marker, CNM [facility]  09/25/2022  4:10 PM Cheral Marker, CNM [facility]  09/29/2022  8:45 AM Desenglau, Shireen Quan, PT [facility] None    Orders Placed This Encounter  Procedures   US OB Follow Up   POC Urinalysis Dipstick OB   Cheral Marker [place], [facility] 09/05/2022 4:43 PM

## 2022-09-08 ENCOUNTER — Encounter: Payer: Medicaid Other | Admitting: Physical Therapy

## 2022-09-11 ENCOUNTER — Encounter: Payer: Medicaid Other | Admitting: Women's Health

## 2022-09-11 ENCOUNTER — Encounter: Payer: Medicaid Other | Admitting: Physical Therapy

## 2022-09-11 ENCOUNTER — Other Ambulatory Visit: Payer: Medicaid Other

## 2022-09-18 ENCOUNTER — Other Ambulatory Visit (HOSPITAL_COMMUNITY)
Admission: RE | Admit: 2022-09-18 | Discharge: 2022-09-18 | Disposition: A | Discharge status: Home / Self Care | Payer: Medicaid Other | Source: Ambulatory Visit | Attending: Women's Health | Admitting: Women's Health

## 2022-09-18 ENCOUNTER — Encounter: Payer: Self-pay | Admitting: Women's Health

## 2022-09-18 ENCOUNTER — Ambulatory Visit (INDEPENDENT_AMBULATORY_CARE_PROVIDER_SITE_OTHER): Payer: Medicaid Other | Admitting: Women's Health

## 2022-09-18 ENCOUNTER — Ambulatory Visit (INDEPENDENT_AMBULATORY_CARE_PROVIDER_SITE_OTHER): Payer: Medicaid Other

## 2022-09-18 VITALS — BP 105/70 | HR 83 | Wt 181.4 lb

## 2022-09-18 DIAGNOSIS — O0993 Supervision of high risk pregnancy, unspecified, third trimester: Secondary | ICD-10-CM | POA: Diagnosis present

## 2022-09-18 DIAGNOSIS — Z3A36 36 weeks gestation of pregnancy: Secondary | ICD-10-CM | POA: Insufficient documentation

## 2022-09-18 DIAGNOSIS — O24410 Gestational diabetes mellitus in pregnancy, diet controlled: Secondary | ICD-10-CM

## 2022-09-18 DIAGNOSIS — Z148 Genetic carrier of other disease: Secondary | ICD-10-CM

## 2022-09-18 LAB — POCT URINALYSIS DIPSTICK OB
Blood, UA: NEGATIVE
Glucose, UA: NEGATIVE
Ketones, UA: NEGATIVE
Leukocytes, UA: NEGATIVE
Nitrite, UA: NEGATIVE
POC,PROTEIN,UA: NEGATIVE

## 2022-09-18 NOTE — Progress Notes (Signed)
[place] 36 wks,cephalic,anterior placenta gr 2,fhr 130 bpm,AFI 15 cm,EFW 2876 g 57%

## 2022-09-18 NOTE — Patient Instructions (Signed)
L[facility], thank you for choosing our office today! We appreciate the opportunity to meet your healthcare needs. You may receive a short survey by mail, e-mail, or through MyChart. If you are happy with your care we would appreciate if you could take just a few minutes to complete the survey questions. We read all of your comments and take your feedback very seriously. Thank you again for choosing our office.  [facility]  [facility] ([address] [place], [place] [address]) Entrance C, located off of [address] Free 24/7 valet parking   CLASSES: Go to [email or web address] to register for classes (childbirth, breastfeeding, waterbirth, infant CPR, daddy bootcamp, etc.)  Call the office ([phone]) or go to [facility] if: You begin to have strong, frequent contractions Your water breaks.  Sometimes it is a big gush of fluid, sometimes it is just a trickle that keeps getting your panties wet or running down your legs You have vaginal bleeding.  It is normal to have a small amount of spotting if your cervix was checked.  You don't feel your baby moving like normal.  If you don't, get you something to eat and drink and lay down and focus on feeling your baby move.   If your baby is still not moving like normal, you should call the office or go to [facility].  Call the office ([phone]) or go to [facility] for these signs of pre-eclampsia: Severe headache that does not go away with Tylenol Visual changes- seeing spots, double, blurred vision Pain under your right breast or upper abdomen that does not go away with Tums or heartburn medicine Nausea and/or vomiting Severe swelling in your hands, feet, and face   Tdap Vaccine It is recommended that you get the Tdap vaccine during the third trimester of EACH pregnancy to help protect your baby from getting pertussis (whooping cough) 27-36 weeks is the BEST time to do  this so that you can pass the protection on to your baby. During pregnancy is better than after pregnancy, but if you are unable to get it during pregnancy it will be offered at the hospital.  You can get this vaccine with us, at the health department, your family doctor, or some local pharmacies Everyone who will be around your baby should also be up-to-date on their vaccines before the baby comes. Adults (who are not pregnant) only need 1 dose of Tdap during adulthood.   [place] Pediatricians/Family Doctors [facility] ([facility]): [address], [phone]           [facility]: [address], [phone]                [facility] ([facility]): [address], [phone] (call to ask if accepting patients) [facility]: [address], [place], [phone]    [place] Pediatricians/Family Doctors [facility] ([facility]): [address], [phone] [facility]: [address], [phone] [facility]: [address], [phone]  [place] [facility]  [facility] ([facility]): [phone] [facility]: [address], [phone]   [place] Family Doctors [facility]: [address], [phone]  [place] [facility]  [facility]: [address], [phone]  Home Blood Pressure Monitoring for Patients   Your provider has recommended that you check your   blood pressure (BP) at least once a week at home. If you do not have a blood pressure cuff at home, one will be provided for you. Contact your provider if you have not received your monitor within 1 week.   Helpful Tips for Accurate Home Blood Pressure Checks  Don't smoke, exercise, or drink caffeine 30 minutes before checking your BP Use the restroom before checking your BP (a full bladder can raise your  pressure) Relax in a comfortable upright chair Feet on the ground Left arm resting comfortably on a flat surface at the level of your heart Legs uncrossed Back supported Sit quietly and don't talk Place the cuff on your bare arm Adjust snuggly, so that only two fingertips can fit between your skin and the top of the cuff Check 2 readings separated by at least one minute Keep a log of your BP readings For a visual, please reference this diagram: http://ccnc.care/bpdiagram  Provider Name: [facility]     Phone: [phone]  Zone 1: ALL CLEAR  Continue to monitor your symptoms:  BP reading is less than 140 (top number) or less than 90 (bottom number)  No right upper stomach pain No headaches or seeing spots No feeling nauseated or throwing up No swelling in face and hands  Zone 2: CAUTION Call your doctor's office for any of the following:  BP reading is greater than 140 (top number) or greater than 90 (bottom number)  Stomach pain under your ribs in the middle or right side Headaches or seeing spots Feeling nauseated or throwing up Swelling in face and hands  Zone 3: EMERGENCY  Seek immediate medical care if you have any of the following:  BP reading is greater than160 (top number) or greater than 110 (bottom number) Severe headaches not improving with Tylenol Serious difficulty catching your breath Any worsening symptoms from Zone 2  Preterm Labor and Birth Information  The normal length of a pregnancy is 39-41 weeks. Preterm labor is when labor starts before 37 completed weeks of pregnancy. What are the risk factors for preterm labor? Preterm labor is more likely to occur in women who: Have certain infections during pregnancy such as a bladder infection, sexually transmitted infection, or infection inside the uterus (chorioamnionitis). Have a shorter-than-normal cervix. Have gone into preterm labor before. Have had surgery on their cervix. Are younger than age 17  or older than age 35. Are African American. Are pregnant with twins or multiple babies (multiple gestation). Take street drugs or smoke while pregnant. Do not gain enough weight while pregnant. Became pregnant shortly after having been pregnant. What are the symptoms of preterm labor? Symptoms of preterm labor include: Cramps similar to those that can happen during a menstrual period. The cramps may happen with diarrhea. Pain in the abdomen or lower back. Regular uterine contractions that may feel like tightening of the abdomen. A feeling of increased pressure in the pelvis. Increased watery or bloody mucus discharge from the vagina. Water breaking (ruptured amniotic sac). Why is it important to recognize signs of preterm labor? It is important to recognize signs of preterm labor because babies who are born prematurely may not be fully developed. This can put them at an increased risk for: Long-term (chronic) heart and lung problems. Difficulty immediately after birth with regulating body systems, including blood sugar, body temperature, heart rate, and breathing rate. Bleeding in the brain. Cerebral palsy. Learning difficulties. Death. These risks are highest for babies who are born before 34 weeks   of pregnancy. How is preterm labor treated? Treatment depends on the length of your pregnancy, your condition, and the health of your baby. It may involve: Having a stitch (suture) placed in your cervix to prevent your cervix from opening too early (cerclage). Taking or being given medicines, such as: Hormone medicines. These may be given early in pregnancy to help support the pregnancy. Medicine to stop contractions. Medicines to help mature the baby's lungs. These may be prescribed if the risk of delivery is high. Medicines to prevent your baby from developing cerebral palsy. If the labor happens before 34 weeks of pregnancy, you may need to stay in the hospital. What should I do if I  think I am in preterm labor? If you think that you are going into preterm labor, call your health care provider right away. How can I prevent preterm labor in future pregnancies? To increase your chance of having a full-term pregnancy: Do not use any tobacco products, such as cigarettes, chewing tobacco, and e-cigarettes. If you need help quitting, ask your health care provider. Do not use street drugs or medicines that have not been prescribed to you during your pregnancy. Talk with your health care provider before taking any herbal supplements, even if you have been taking them regularly. Make sure you gain a healthy amount of weight during your pregnancy. Watch for infection. If you think that you might have an infection, get it checked right away. Make sure to tell your health care provider if you have gone into preterm labor before. This information is not intended to replace advice given to you by your health care provider. Make sure you discuss any questions you have with your health care provider. Document Revised: 08/16/2018 Document Reviewed: 09/15/2015 Elsevier Patient Education  2020 Elsevier Inc.   

## 2022-09-18 NOTE — Progress Notes (Signed)
HIGH-RISK PREGNANCY VISIT Patient name: Katherine [name] MRN [id number]  Date of birth: 1990/07/20 Chief Complaint:   Routine Prenatal Visit  History of Present Illness:   Katherine [name] is a 32 y.o. G18P1011 female at [redacted]w[redacted]d with an Estimated Date of Delivery: 10/16/22 being seen today for ongoing management of a high-risk pregnancy complicated by diabetes mellitus A1DM.    Today she reports  all fbs <95, only 2 postprandials elevated (highest 140-had cookie and fruit at work) . Contractions: Not present. Vag. Bleeding: None.  Movement: Present. denies leaking of fluid.      07/21/2022    8:30 AM 04/06/2022    3:17 PM 06/04/2020   11:16 AM 08/01/2019   10:03 AM 12/11/2016    2:38 PM  Depression screen PHQ 2/9  Decreased Interest 0 0 0 0 1  Down, Depressed, Hopeless 0 0 0 0 0  PHQ - 2 Score 0 0 0 0 1  Altered sleeping 2 1 0  0  Tired, decreased energy 1 1 1  2   Change in appetite 0 0 0  1  Feeling bad or failure about yourself  0 0 0  0  Trouble concentrating 0 0 0  0  Moving slowly or fidgety/restless 0 0   0  Suicidal thoughts 0 0   0  PHQ-9 Score 3 2 1  4   Difficult doing work/chores Somewhat difficult  Not difficult at all          07/21/2022    8:31 AM 04/06/2022    3:17 PM 06/04/2020   11:17 AM  GAD 7 : Generalized Anxiety Score  Nervous, Anxious, on Edge 0 1 1  Control/stop worrying 0 0 2  Worry too much - different things 1 0 2  Trouble relaxing 1 1 1   Restless 1 0 1  Easily annoyed or irritable 1 2 1   Afraid - awful might happen 0 0 0  Total GAD 7 Score 4 4 8   Anxiety Difficulty Not difficult at all  Not difficult at all     Review of Systems:   Pertinent items are noted in HPI Denies abnormal vaginal discharge w/ itching/odor/irritation, headaches, visual changes, shortness of breath, chest pain, abdominal pain, severe nausea/vomiting, or problems with urination or bowel movements unless otherwise stated above. Pertinent History Reviewed:  Reviewed past  medical,surgical, social, obstetrical and family history.  Reviewed problem list, medications and allergies. Physical Assessment:   Vitals:   09/18/22 1541  BP: 105/70  Pulse: 83  Weight: 181 lb 6.4 oz (82.3 kg)  Body mass index is 32.13 kg/m.           Physical Examination:   General appearance: alert, well appearing, and in no distress  Mental status: alert, oriented to person, place, and time  Skin: warm & dry   Extremities:      Cardiovascular: normal heart rate noted  Respiratory: normal respiratory effort, no distress  Abdomen: gravid, soft, non-tender  Pelvic: GBS, gc/ct collected, Cervical exam deferred         Fetal Status: Fetal Heart Rate (bpm): 130 u/s   Movement: Present Presentation: Vertex  Fetal Surveillance Testing today: [place] 36 wks,cephalic,anterior placenta gr 2,fhr 130 bpm,AFI 15 cm,EFW 2876 g 57%   Chaperone: Faith Rogue    Results for orders placed or performed in visit on 09/18/22 (from the past 24 hour(s))  POC Urinalysis Dipstick OB   Collection Time: 09/18/22  3:43 PM  Result Value Ref Range  Color, UA     Clarity, UA     Glucose, UA Negative Negative   Bilirubin, UA     Ketones, UA neg    Spec Grav, UA     Blood, UA neg    pH, UA     POC,PROTEIN,UA Negative Negative, Trace, Small (1+), Moderate (2+), Large (3+), 4+   Urobilinogen, UA     Nitrite, UA neg    Leukocytes, UA Negative Negative   Appearance     Odor      Assessment & Plan:  High-risk pregnancy: G3P1011 at [redacted]w[redacted]d with an Estimated Date of Delivery: 10/16/22   1) A1DM, stable, EFW 57% today, extrapolates to 3700g @ 40wks  2) H/O macrosomic infant, 9lb5oz @ 40.5wks w/ any issues  Meds: No orders of the defined types were placed in this encounter.  Labs/procedures today: GBS, GC/CT, and U/S  Treatment Plan:  IOL 39-40wks (pt leaning towards 40wks)  Reviewed: Preterm labor symptoms and general obstetric precautions including but not limited to vaginal bleeding,  contractions, leaking of fluid and fetal movement were reviewed in detail with the patient.  All questions were answered. Does have home bp cuff. Office bp cuff given: not applicable. Check bp weekly, let us know if consistently >140 and/or >90.  Follow-up: Return for HROB, MD or CNM, weekly.   Future Appointments  Date Time Provider Department Center  09/25/2022  4:10 PM Cheral Marker, [place] [facility]  09/29/2022  8:45 AM Desenglau, Shireen Quan, PT [facility] None  10/04/2022  4:10 PM Myna Hidalgo, DO [facility]    Orders Placed This Encounter  Procedures   Culture, beta strep (group b only)   POC Urinalysis Dipstick OB   Cheral Marker [place], [facility] 09/18/2022  4:26 PM

## 2022-09-20 LAB — CERVICOVAGINAL ANCILLARY ONLY
Chlamydia: NEGATIVE
Comment: NEGATIVE
Comment: NORMAL
Neisseria Gonorrhea: NEGATIVE

## 2022-09-22 LAB — CULTURE, BETA STREP (GROUP B ONLY): Strep Gp B Culture: NEGATIVE

## 2022-09-25 ENCOUNTER — Encounter: Payer: Self-pay | Admitting: Women's Health

## 2022-09-25 ENCOUNTER — Ambulatory Visit (INDEPENDENT_AMBULATORY_CARE_PROVIDER_SITE_OTHER): Payer: Medicaid Other | Admitting: Women's Health

## 2022-09-25 VITALS — BP 99/62 | HR 89 | Wt 182.0 lb

## 2022-09-25 DIAGNOSIS — Z331 Pregnant state, incidental: Secondary | ICD-10-CM

## 2022-09-25 DIAGNOSIS — O24410 Gestational diabetes mellitus in pregnancy, diet controlled: Secondary | ICD-10-CM

## 2022-09-25 DIAGNOSIS — O0993 Supervision of high risk pregnancy, unspecified, third trimester: Secondary | ICD-10-CM

## 2022-09-25 DIAGNOSIS — Z1389 Encounter for screening for other disorder: Secondary | ICD-10-CM

## 2022-09-25 DIAGNOSIS — Z3A37 37 weeks gestation of pregnancy: Secondary | ICD-10-CM

## 2022-09-25 LAB — POCT URINALYSIS DIPSTICK OB
Blood, UA: NEGATIVE
Glucose, UA: NEGATIVE
Ketones, UA: NEGATIVE
Leukocytes, UA: NEGATIVE
Nitrite, UA: NEGATIVE
POC,PROTEIN,UA: NEGATIVE

## 2022-09-25 NOTE — Patient Instructions (Signed)
Katherine [name], thank you for choosing our office today! We appreciate the opportunity to meet your healthcare needs. You may receive a short survey by mail, e-mail, or through MyChart. If you are happy with your care we would appreciate if you could take just a few minutes to complete the survey questions. We read all of your comments and take your feedback very seriously. Thank you again for choosing our office.  [facility] ([address], [place] [address]) Entrance C, located off of [address] Free 24/7 valet parking   CLASSES: Go to [email or web address] to register for classes (childbirth, breastfeeding, waterbirth, infant CPR, daddy bootcamp, etc.)  Call the office ([phone]) or go to [facility] if: You begin to have strong, frequent contractions Your water breaks.  Sometimes it is a big gush of fluid, sometimes it is just a trickle that keeps getting your panties wet or running down your legs You have vaginal bleeding.  It is normal to have a small amount of spotting if your cervix was checked.  You don't feel your baby moving like normal.  If you don't, get you something to eat and drink and lay down and focus on feeling your baby move.   If your baby is still not moving like normal, you should call the office or go to [facility].  Call the office ([phone]) or go to [facility] for these signs of pre-eclampsia: Severe headache that does not go away with Tylenol Visual changes- seeing spots, double, blurred vision Pain under your right breast or upper abdomen that does not go away with Tums or heartburn medicine Nausea and/or vomiting Severe swelling in your hands, feet, and face   [place] Pediatric[facility]/Family Doctors [facility] ([facility]): [address], [phone]           [facility]: [address], [phone]                 [facility] ([facility]): [address], [phone] (call to ask if accepting patients) [facility]: [address], [place], [phone]    [place] Pediatric[facility]/Family Doctors [facility] ([facility]): [address], [phone] [facility]: [address], [phone] [facility]: [address], [phone]  [place] [facility]  [facility] ([facility]): [phone] [facility]: [address], [phone]   [place] [facility]: [address], [phone]  [place] [facility]  [facility]: [address], [phone]  Home Blood Pressure Monitoring for Patients   Your provider has recommended that you check your blood pressure (BP) at least once a week at home. If you do not have a blood pressure cuff at home, one will be provided for you. Contact your provider if you have not received your monitor within 1 week.   Helpful Tips for Accurate Home Blood Pressure Checks  Don't smoke, exercise, or drink caffeine 30 minutes before checking your BP Use the restroom before checking your BP (a full bladder can raise your pressure) Relax in a comfortable upright chair Feet on the ground Left arm resting comfortably on a flat surface at the level of your heart Legs uncrossed Back supported Sit quietly and don't talk Place the cuff on your bare arm Adjust snuggly, so that only two fingertips   can fit between your skin and the top of the cuff Check 2 readings separated by at least one minute Keep a log of your BP readings For a visual, please reference this diagram: http://ccnc.care/bpdiagram  Provider Name: [facility]     Phone: [phone]  Zone 1: ALL CLEAR  Continue to monitor your symptoms:  BP reading is less than 140 (top number) or less than 90 (bottom number)  No right  upper stomach pain No headaches or seeing spots No feeling nauseated or throwing up No swelling in face and hands  Zone 2: CAUTION Call your doctor's office for any of the following:  BP reading is greater than 140 (top number) or greater than 90 (bottom number)  Stomach pain under your ribs in the middle or right side Headaches or seeing spots Feeling nauseated or throwing up Swelling in face and hands  Zone 3: EMERGENCY  Seek immediate medical care if you have any of the following:  BP reading is greater than160 (top number) or greater than 110 (bottom number) Severe headaches not improving with Tylenol Serious difficulty catching your breath Any worsening symptoms from Zone 2   Braxton Hicks Contractions Contractions of the uterus can occur throughout pregnancy, but they are not always a sign that you are in labor. You may have practice contractions called Braxton Hicks contractions. These false labor contractions are sometimes confused with true labor. What are Braxton Hicks contractions? Braxton Hicks contractions are tightening movements that occur in the muscles of the uterus before labor. Unlike true labor contractions, these contractions do not result in opening (dilation) and thinning of the cervix. Toward the end of pregnancy (32-34 weeks), Braxton Hicks contractions can happen more often and may become stronger. These contractions are sometimes difficult to tell apart from true labor because they can be very uncomfortable. You should not feel embarrassed if you go to the hospital with false labor. Sometimes, the only way to tell if you are in true labor is for your health care provider to look for changes in the cervix. The health care provider will do a physical exam and may monitor your contractions. If you are not in true labor, the exam should show that your cervix is not dilating and your water has not broken. If there are no other health problems associated with your  pregnancy, it is completely safe for you to be sent home with false labor. You may continue to have Braxton Hicks contractions until you go into true labor. How to tell the difference between true labor and false labor True labor Contractions last 30-70 seconds. Contractions become very regular. Discomfort is usually felt in the top of the uterus, and it spreads to the lower abdomen and low back. Contractions do not go away with walking. Contractions usually become more intense and increase in frequency. The cervix dilates and gets thinner. False labor Contractions are usually shorter and not as strong as true labor contractions. Contractions are usually irregular. Contractions are often felt in the front of the lower abdomen and in the groin. Contractions may go away when you walk around or change positions while lying down. Contractions get weaker and are shorter-lasting as time goes on. The cervix usually does not dilate or become thin. Follow these instructions at home:  Take over-the-counter and prescription medicines only as told by your health care provider. Keep up with your usual exercises and follow other instructions from your health care provider. Eat and drink lightly if you think   you are going into labor. If Braxton Hicks contractions are making you uncomfortable: Change your position from lying down or resting to walking, or change from walking to resting. Sit and rest in a tub of warm water. Drink enough fluid to keep your urine pale yellow. Dehydration may cause these contractions. Do slow and deep breathing several times an hour. Keep all follow-up prenatal visits as told by your health care provider. This is important. Contact a health care provider if: You have a fever. You have continuous pain in your abdomen. Get help right away if: Your contractions become stronger, more regular, and closer together. You have fluid leaking or gushing from your vagina. You pass  blood-tinged mucus (bloody show). You have bleeding from your vagina. You have low back pain that you never had before. You feel your baby's head pushing down and causing pelvic pressure. Your baby is not moving inside you as much as it used to. Summary Contractions that occur before labor are called Braxton Hicks contractions, false labor, or practice contractions. Braxton Hicks contractions are usually shorter, weaker, farther apart, and less regular than true labor contractions. True labor contractions usually become progressively stronger and regular, and they become more frequent. Manage discomfort from Braxton Hicks contractions by changing position, resting in a warm bath, drinking plenty of water, or practicing deep breathing. This information is not intended to replace advice given to you by your health care provider. Make sure you discuss any questions you have with your health care provider. Document Revised: 04/06/2017 Document Reviewed: 09/07/2016 Elsevier Patient Education  2020 Elsevier Inc[employer]   

## 2022-09-25 NOTE — Progress Notes (Signed)
HIGH-RISK PREGNANCY VISIT Patient name: Katherine [name] MRN [id number]  Date of birth: Feb 12, 1991 Chief Complaint:   Routine Prenatal Visit  History of Present Illness:   Katherine [name] is a 32 y.o. G74P1011 female at [redacted]w[redacted]d with an Estimated Date of Delivery: 10/16/22 being seen today for ongoing management of a high-risk pregnancy complicated by diabetes mellitus A1DM.    Today she reports no complaints. All sugars wnl, except one 2hr of 140 (drank sweet tea). Wants IOL 6/7 @ [facility]. Contractions: Not present.  .  Movement: Present. denies leaking of fluid.      07/21/2022    8:30 AM 04/06/2022    3:17 PM 06/04/2020   11:16 AM 08/01/2019   10:03 AM 12/11/2016    2:38 PM  Depression screen PHQ 2/9  Decreased Interest 0 0 0 0 1  Down, Depressed, Hopeless 0 0 0 0 0  PHQ - 2 Score 0 0 0 0 1  Altered sleeping 2 1 0  0  Tired, decreased energy 1 1 1  2   Change in appetite 0 0 0  1  Feeling bad or failure about yourself  0 0 0  0  Trouble concentrating 0 0 0  0  Moving slowly or fidgety/restless 0 0   0  Suicidal thoughts 0 0   0  PHQ-9 Score 3 2 1  4   Difficult doing work/chores Somewhat difficult  Not difficult at all          07/21/2022    8:31 AM 04/06/2022    3:17 PM 06/04/2020   11:17 AM  GAD 7 : Generalized Anxiety Score  Nervous, Anxious, on Edge 0 1 1  Control/stop worrying 0 0 2  Worry too much - different things 1 0 2  Trouble relaxing 1 1 1   Restless 1 0 1  Easily annoyed or irritable 1 2 1   Afraid - awful might happen 0 0 0  Total GAD 7 Score 4 4 8   Anxiety Difficulty Not difficult at all  Not difficult at all     Review of Systems:   Pertinent items are noted in HPI Denies abnormal vaginal discharge w/ itching/odor/irritation, headaches, visual changes, shortness of breath, chest pain, abdominal pain, severe nausea/vomiting, or problems with urination or bowel movements unless otherwise stated above. Pertinent History Reviewed:  Reviewed past medical,surgical, social,  obstetrical and family history.  Reviewed problem list, medications and allergies. Physical Assessment:   Vitals:   09/25/22 1605  BP: 99/62  Pulse: 89  Weight: 182 lb (82.6 kg)  Body mass index is 32.24 kg/m.           Physical Examination:   General appearance: alert, well appearing, and in no distress  Mental status: alert, oriented to person, place, and time  Skin: warm & dry   Extremities: Edema: None    Cardiovascular: normal heart rate noted  Respiratory: normal respiratory effort, no distress  Abdomen: gravid, soft, non-tender  Pelvic: Cervical exam deferred         Fetal Status: Fetal Heart Rate (bpm): 142 Fundal Height: 35 cm Movement: Present    Fetal Surveillance Testing today: doppler   Chaperone: N/A    Results for orders placed or performed in visit on 09/25/22 (from the past 24 hour(s))  POC Urinalysis Dipstick OB   Collection Time: 09/25/22  4:17 PM  Result Value Ref Range   Color, UA     Clarity, UA     Glucose, UA Negative Negative  Bilirubin, UA     Ketones, UA negative    Spec Grav, UA     Blood, UA negative    pH, UA     POC,PROTEIN,UA Negative Negative, Trace, Small (1+), Moderate (2+), Large (3+), 4+   Urobilinogen, UA     Nitrite, UA negative    Leukocytes, UA Negative Negative   Appearance     Odor      Assessment & Plan:  High-risk pregnancy: G3P1011 at [redacted]w[redacted]d with an Estimated Date of Delivery: 10/16/22   1) A1DM, stable, EFW 57% @ 36wks, IOL scheduled for 6/7 at [facility] per pt preference.  IOL form faxed and orders placed   Meds: No orders of the defined types were placed in this encounter.   Labs/procedures today: none  Treatment Plan:   IOL 39-40wks  Reviewed: Term labor symptoms and general obstetric precautions including but not limited to vaginal bleeding, contractions, leaking of fluid and fetal movement were reviewed in detail with the patient.  All questions were answered. Does have home bp cuff. Office bp cuff given: not  applicable. Check bp weekly, let us know if consistently >140 and/or >90.  Follow-up: Return for As scheduled.   Future Appointments  Date Time Provider Department Center  09/29/2022  8:45 AM Desenglau, Shireen Quan, PT [facility] None  10/04/2022  4:10 PM Myna Hidalgo, DO [facility]  10/13/2022 12:00 AM [facility] SCHED ROOM [facility] None    Orders Placed This Encounter  Procedures   POC Urinalysis Dipstick OB   Cheral Marker [place], [facility] 09/25/2022 4:41 PM

## 2022-09-29 ENCOUNTER — Ambulatory Visit: Payer: Medicaid Other | Admitting: Physical Therapy

## 2022-10-04 ENCOUNTER — Ambulatory Visit (INDEPENDENT_AMBULATORY_CARE_PROVIDER_SITE_OTHER): Payer: Medicaid Other | Admitting: Obstetrics & Gynecology

## 2022-10-04 VITALS — BP 107/69 | HR 83 | Wt 181.6 lb

## 2022-10-04 DIAGNOSIS — O24410 Gestational diabetes mellitus in pregnancy, diet controlled: Secondary | ICD-10-CM

## 2022-10-04 DIAGNOSIS — M62838 Other muscle spasm: Secondary | ICD-10-CM

## 2022-10-04 DIAGNOSIS — O0993 Supervision of high risk pregnancy, unspecified, third trimester: Secondary | ICD-10-CM

## 2022-10-04 DIAGNOSIS — Z3A38 38 weeks gestation of pregnancy: Secondary | ICD-10-CM

## 2022-10-04 MED ORDER — CYCLOBENZAPRINE HCL 10 MG PO TABS
10.0000 mg | ORAL_TABLET | Freq: Three times a day (TID) | ORAL | 0 refills | Status: DC | PRN
Start: 2022-10-04 — End: 2022-12-27

## 2022-10-04 NOTE — Progress Notes (Signed)
HIGH-RISK PREGNANCY VISIT Patient name: Katherine [name] MRN [id number]  Date of birth: 03/06/1991 Chief Complaint:   Routine Prenatal Visit  History of Present Illness:   Katherine [name] is a 32 y.o. G63P1011 female at [redacted]w[redacted]d with an Estimated Date of Delivery: 10/16/22 being seen today for ongoing management of a high-risk pregnancy complicated by:  -GDMA1 Mostly controlled with diet  Today she reports left sided-back pain.  Notes tightness and discomfort- minimal to no improvement with tylenol.  Pt asking about muscle relaxer.  Contractions: Not present. Vag. Bleeding: None.  Movement: Present. denies leaking of fluid.      07/21/2022    8:30 AM 04/06/2022    3:17 PM 06/04/2020   11:16 AM 08/01/2019   10:03 AM 12/11/2016    2:38 PM  Depression screen PHQ 2/9  Decreased Interest 0 0 0 0 1  Down, Depressed, Hopeless 0 0 0 0 0  PHQ - 2 Score 0 0 0 0 1  Altered sleeping 2 1 0  0  Tired, decreased energy 1 1 1  2   Change in appetite 0 0 0  1  Feeling bad or failure about yourself  0 0 0  0  Trouble concentrating 0 0 0  0  Moving slowly or fidgety/restless 0 0   0  Suicidal thoughts 0 0   0  PHQ-9 Score 3 2 1  4   Difficult doing work/chores Somewhat difficult  Not difficult at all       Current Outpatient Medications  Medication Instructions   Accu-Chek Softclix Lancets lancets Use as instructed to check blood sugar 4 times daily   acetaminophen (TYLENOL) 500 mg, Oral, As needed   Blood Glucose Monitoring Suppl (ACCU-CHEK GUIDE ME) w/Device KIT 1 each, Does not apply, 4 times daily   Calcium Carbonate Antacid (TUMS PO) 1 tablet, Oral, Daily PRN   cyclobenzaprine (FLEXERIL) 10 mg, Oral, 3 times daily PRN   fluticasone (FLONASE) 50 MCG/ACT nasal spray    glucose blood (ACCU-CHEK GUIDE) test strip Use as instructed to check blood sugar 4 times daily   levocetirizine (XYZAL) 5 MG tablet SMARTSIG:1 Tablet(s) By Mouth Every Evening   omeprazole (PRILOSEC) 20 mg, Oral, Daily, 1 tablet a day    Prenatal Vit-Fe Fumarate-FA (WESTAB PLUS) 27-1 MG TABS TAKE 1 TABLET BY MOUTH EVERY DAY AT 12 NOON     Review of Systems:   Pertinent items are noted in HPI Denies abnormal vaginal discharge w/ itching/odor/irritation, headaches, visual changes, shortness of breath, chest pain, abdominal pain, severe nausea/vomiting, or problems with urination or bowel movements unless otherwise stated above. Pertinent History Reviewed:  Reviewed past medical,surgical, social, obstetrical and family history.  Reviewed problem list, medications and allergies. Physical Assessment:   Vitals:   10/04/22 1605  BP: 107/69  Pulse: 83  Weight: 181 lb 9.6 oz (82.4 kg)  Body mass index is 32.17 kg/m.           Physical Examination:   General appearance: alert, well appearing, and in no distress  Mental status: normal mood, behavior, speech, dress, motor activity, and thought processes  Skin: warm & dry   Extremities: Edema: None    Cardiovascular: normal heart rate noted  Respiratory: normal respiratory effort, no distress  Abdomen: gravid, soft, non-tender  Pelvic: Cervical exam performed  Dilation: 1.5 Effacement (%): 30 Station: -3  Fetal Status: Fetal Heart Rate (bpm): 130 Fundal Height: 37 cm Movement: Present    Fetal Surveillance Testing today: none  Chaperone:  pt declined     No results found for this or any previous visit (from the past 24 hour(s)).   Assessment & Plan:  High-risk pregnancy: G3P1011 at [redacted]w[redacted]d with an Estimated Date of Delivery: 10/16/22   1) GDMA1 -growth AGA @ 36wks -scheduled for IOL next week  2) Muscle spasm/pain -encouraged heating pack -flexeril sent in   Meds:  Meds ordered this encounter  Medications   cyclobenzaprine (FLEXERIL) 10 MG tablet    Sig: Take 1 tablet (10 mg total) by mouth 3 (three) times daily as needed for muscle spasms.    Dispense:  5 tablet    Refill:  0    Labs/procedures today: doppler  Treatment Plan:  continue routine OB  care  Reviewed: Term labor symptoms and general obstetric precautions including but not limited to vaginal bleeding, contractions, leaking of fluid and fetal movement were reviewed in detail with the patient.  All questions were answered.  Follow-up: Return in about 1 week (around 10/11/2022) for HROB visit.   Future Appointments  Date Time Provider Department Center  10/10/2022  9:50 AM Cheral Marker, CNM [facility]  10/13/2022 12:00 AM [facility] SCHED ROOM [facility] None    No orders of the defined types were placed in this encounter.   Myna Hidalgo, DO Attending Obstetrician & Gynecologist, [facility] for [employer], [facility]

## 2022-10-06 ENCOUNTER — Encounter: Payer: Medicaid Other | Admitting: Physical Therapy

## 2022-10-06 ENCOUNTER — Encounter (HOSPITAL_COMMUNITY): Payer: Self-pay

## 2022-10-06 ENCOUNTER — Telehealth (HOSPITAL_COMMUNITY): Payer: Self-pay | Admitting: *Deleted

## 2022-10-06 NOTE — Telephone Encounter (Signed)
Preadmission screen  

## 2022-10-08 ENCOUNTER — Other Ambulatory Visit: Payer: Self-pay | Admitting: Advanced Practice Midwife

## 2022-10-09 ENCOUNTER — Telehealth (HOSPITAL_COMMUNITY): Payer: Self-pay | Admitting: *Deleted

## 2022-10-09 ENCOUNTER — Encounter (HOSPITAL_COMMUNITY): Payer: Self-pay | Admitting: *Deleted

## 2022-10-09 NOTE — Telephone Encounter (Signed)
Preadmission screen  

## 2022-10-10 ENCOUNTER — Ambulatory Visit (INDEPENDENT_AMBULATORY_CARE_PROVIDER_SITE_OTHER): Payer: Medicaid Other | Admitting: Women's Health

## 2022-10-10 ENCOUNTER — Encounter: Payer: Self-pay | Admitting: Women's Health

## 2022-10-10 VITALS — BP 104/68 | HR 87 | Wt 180.0 lb

## 2022-10-10 DIAGNOSIS — O0993 Supervision of high risk pregnancy, unspecified, third trimester: Secondary | ICD-10-CM

## 2022-10-10 DIAGNOSIS — O24410 Gestational diabetes mellitus in pregnancy, diet controlled: Secondary | ICD-10-CM

## 2022-10-10 DIAGNOSIS — Z3A39 39 weeks gestation of pregnancy: Secondary | ICD-10-CM

## 2022-10-10 NOTE — Patient Instructions (Signed)
Evangela, thank you for choosing our office today! We appreciate the opportunity to meet your healthcare needs. You may receive a short survey by mail, e-mail, or through MyChart. If you are happy with your care we would appreciate if you could take just a few minutes to complete the survey questions. We read all of your comments and take your feedback very seriously. Thank you again for choosing our office.  [facility] ([address] [place], [place] [address]) Entrance C, located off of [address] Free 24/7 valet parking   CLASSES: Go to [email or web address] to register for classes (childbirth, breastfeeding, waterbirth, infant CPR, daddy bootcamp, etc.)  Call the office ([phone]) or go to [facility] if: You begin to have strong, frequent contractions Your water breaks.  Sometimes it is a big gush of fluid, sometimes it is just a trickle that keeps getting your panties wet or running down your legs You have vaginal bleeding.  It is normal to have a small amount of spotting if your cervix was checked.  You don't feel your baby moving like normal.  If you don't, get you something to eat and drink and lay down and focus on feeling your baby move.   If your baby is still not moving like normal, you should call the office or go to [facility].  Call the office ([phone]) or go to [facility] for these signs of pre-eclampsia: Severe headache that does not go away with Tylenol Visual changes- seeing spots, double, blurred vision Pain under your right breast or upper abdomen that does not go away with Tums or heartburn medicine Nausea and/or vomiting Severe swelling in your hands, feet, and face   [place] Pediatricians/Family Doctors [facility] ([facility]): [address], [phone]           [facility]: [address], [phone]                 [facility] ([facility]): [address], [phone] (call to ask if accepting patients) [facility]: [address], [place], [phone]    [place] Pediatricians/Family Doctors [facility] ([facility]): [address], [phone] [facility]: [address], [phone] [facility]: [address], [phone]  [place] [facility]  [facility] ([facility]): [phone] [facility]: [address], [phone]   [place] [facility]: [address], [phone]  [place] [facility]  [facility]: [address], [phone]  Home Blood Pressure Monitoring for Patients   Your provider has recommended that you check your blood pressure (BP) at least once a week at home. If you do not have a blood pressure cuff at home, one will be provided for you. Contact your provider if you have not received your monitor within 1 week.   Helpful Tips for Accurate Home Blood Pressure Checks  Don't smoke, exercise, or drink caffeine 30 minutes before checking your BP Use the restroom before checking your BP (a full bladder can raise your pressure) Relax in a comfortable upright chair Feet on the ground Left arm resting comfortably on a flat surface at the level of your heart Legs uncrossed Back supported Sit quietly and don't talk Place the cuff on your bare arm Adjust snuggly, so that only two fingertips   can fit between your skin and the top of the cuff Check 2 readings separated by at least one minute Keep a log of your BP readings For a visual, please reference this diagram: http://ccnc.care/bpdiagram  Provider Name: [facility]     Phone: [phone]  Zone 1: ALL CLEAR  Continue to monitor your symptoms:  BP reading is less than 140 (top number) or less than 90 (bottom number)  No right  upper stomach pain No headaches or seeing spots No feeling nauseated or throwing up No swelling in face and hands  Zone 2: CAUTION Call your doctor's office for any of the following:  BP reading is greater than 140 (top number) or greater than 90 (bottom number)  Stomach pain under your ribs in the middle or right side Headaches or seeing spots Feeling nauseated or throwing up Swelling in face and hands  Zone 3: EMERGENCY  Seek immediate medical care if you have any of the following:  BP reading is greater than160 (top number) or greater than 110 (bottom number) Severe headaches not improving with Tylenol Serious difficulty catching your breath Any worsening symptoms from Zone 2   Braxton Hicks Contractions Contractions of the uterus can occur throughout pregnancy, but they are not always a sign that you are in labor. You may have practice contractions called Braxton Hicks contractions. These false labor contractions are sometimes confused with true labor. What are Braxton Hicks contractions? Braxton Hicks contractions are tightening movements that occur in the muscles of the uterus before labor. Unlike true labor contractions, these contractions do not result in opening (dilation) and thinning of the cervix. Toward the end of pregnancy (32-34 weeks), Braxton Hicks contractions can happen more often and may become stronger. These contractions are sometimes difficult to tell apart from true labor because they can be very uncomfortable. You should not feel embarrassed if you go to the hospital with false labor. Sometimes, the only way to tell if you are in true labor is for your health care provider to look for changes in the cervix. The health care provider will do a physical exam and may monitor your contractions. If you are not in true labor, the exam should show that your cervix is not dilating and your water has not broken. If there are no other health problems associated with your  pregnancy, it is completely safe for you to be sent home with false labor. You may continue to have Braxton Hicks contractions until you go into true labor. How to tell the difference between true labor and false labor True labor Contractions last 30-70 seconds. Contractions become very regular. Discomfort is usually felt in the top of the uterus, and it spreads to the lower abdomen and low back. Contractions do not go away with walking. Contractions usually become more intense and increase in frequency. The cervix dilates and gets thinner. False labor Contractions are usually shorter and not as strong as true labor contractions. Contractions are usually irregular. Contractions are often felt in the front of the lower abdomen and in the groin. Contractions may go away when you walk around or change positions while lying down. Contractions get weaker and are shorter-lasting as time goes on. The cervix usually does not dilate or become thin. Follow these instructions at home:  Take over-the-counter and prescription medicines only as told by your health care provider. Keep up with your usual exercises and follow other instructions from your health care provider. Eat and drink lightly if you think   you are going into labor. If Braxton Hicks contractions are making you uncomfortable: Change your position from lying down or resting to walking, or change from walking to resting. Sit and rest in a tub of warm water. Drink enough fluid to keep your urine pale yellow. Dehydration may cause these contractions. Do slow and deep breathing several times an hour. Keep all follow-up prenatal visits as told by your health care provider. This is important. Contact a health care provider if: You have a fever. You have continuous pain in your abdomen. Get help right away if: Your contractions become stronger, more regular, and closer together. You have fluid leaking or gushing from your vagina. You pass  blood-tinged mucus (bloody show). You have bleeding from your vagina. You have low back pain that you never had before. You feel your baby's head pushing down and causing pelvic pressure. Your baby is not moving inside you as much as it used to. Summary Contractions that occur before labor are called Braxton Hicks contractions, false labor, or practice contractions. Braxton Hicks contractions are usually shorter, weaker, farther apart, and less regular than true labor contractions. True labor contractions usually become progressively stronger and regular, and they become more frequent. Manage discomfort from Braxton Hicks contractions by changing position, resting in a warm bath, drinking plenty of water, or practicing deep breathing. This information is not intended to replace advice given to you by your health care provider. Make sure you discuss any questions you have with your health care provider. Document Revised: 04/06/2017 Document Reviewed: 09/07/2016 Elsevier Patient Education  2020 Elsevier Inc.   

## 2022-10-10 NOTE — Progress Notes (Signed)
HIGH-RISK PREGNANCY VISIT Patient name: Katherine [name] MRN [id number]  Date of birth: [birth year] Chief Complaint:   Routine Prenatal Visit (Cervical check)  History of Present Illness:   Katherine [name] is a 32 y.o. G44P1011 female at [redacted]w[redacted]d with an Estimated Date of Delivery: 10/16/22 being seen today for ongoing management of a high-risk pregnancy complicated by diabetes mellitus A1DM.    Today she reports  sugars are good when checking, forgets sometimes . Contractions: Not present.  .  Movement: Present. denies leaking of fluid.      07/21/2022    8:30 AM 04/06/2022    3:17 PM 06/04/2020   11:16 AM 08/01/2019   10:03 AM 12/11/2016    2:38 PM  Depression screen PHQ 2/9  Decreased Interest 0 0 0 0 1  Down, Depressed, Hopeless 0 0 0 0 0  PHQ - 2 Score 0 0 0 0 1  Altered sleeping 2 1 0  0  Tired, decreased energy 1 1 1  2   Change in appetite 0 0 0  1  Feeling bad or failure about yourself  0 0 0  0  Trouble concentrating 0 0 0  0  Moving slowly or fidgety/restless 0 0   0  Suicidal thoughts 0 0   0  PHQ-9 Score 3 2 1  4   Difficult doing work/chores Somewhat difficult  Not difficult at all          07/21/2022    8:31 AM 04/06/2022    3:17 PM 06/04/2020   11:17 AM  GAD 7 : Generalized Anxiety Score  Nervous, Anxious, on Edge 0 1 1  Control/stop worrying 0 0 2  Worry too much - different things 1 0 2  Trouble relaxing 1 1 1   Restless 1 0 1  Easily annoyed or irritable 1 2 1   Afraid - awful might happen 0 0 0  Total GAD 7 Score 4 4 8   Anxiety Difficulty Not difficult at all  Not difficult at all     Review of Systems:   Pertinent items are noted in HPI Denies abnormal vaginal discharge w/ itching/odor/irritation, headaches, visual changes, shortness of breath, chest pain, abdominal pain, severe nausea/vomiting, or problems with urination or bowel movements unless otherwise stated above. Pertinent History Reviewed:  Reviewed past medical,surgical, social, obstetrical and family  history.  Reviewed problem list, medications and allergies. Physical Assessment:   Vitals:   10/10/22 0952  BP: 104/68  Pulse: 87  Weight: 180 lb (81.6 kg)  Body mass index is 31.89 kg/m.           Physical Examination:   General appearance: alert, well appearing, and in no distress  Mental status: alert, oriented to person, place, and time  Skin: warm & dry   Extremities: Edema: None    Cardiovascular: normal heart rate noted  Respiratory: normal respiratory effort, no distress  Abdomen: gravid, soft, non-tender  Pelvic: Cervical exam performed  Dilation: 2 Effacement (%): 60 Station: -3, -2, declined membrane sweep  Fetal Status: Fetal Heart Rate (bpm): 140 Fundal Height: 37 cm Movement: Present Presentation: Vertex  Fetal Surveillance Testing today: doppler   Chaperone: pt declined    No results found for this or any previous visit (from the past 24 hour(s)).  Assessment & Plan:  High-risk pregnancy: G3P1011 at [redacted]w[redacted]d with an Estimated Date of Delivery: 10/16/22   1) A1DM, stable, has IOL scheduled this week (6/7 MN). EFW 57% @ 36wks  Meds: No orders of  the defined types were placed in this encounter.  Labs/procedures today: SVE  Treatment Plan:  IOL 5/7 as previously scheduled  Reviewed: Term labor symptoms and general obstetric precautions including but not limited to vaginal bleeding, contractions, leaking of fluid and fetal movement were reviewed in detail with the patient.  All questions were answered. Does have home bp cuff. Office bp cuff given: not applicable. Check bp weekly, let us know if consistently >140 and/or >90.  Follow-up: Return for will schedule pp visit after delivery.   Future Appointments  Date Time Provider Department Center  10/13/2022 12:00 AM [facility] SCHED ROOM [facility] None    No orders of the defined types were placed in this encounter.  Cheral Marker CNM, [facility] 10/10/2022 10:29 AM

## 2022-10-13 ENCOUNTER — Encounter (HOSPITAL_COMMUNITY): Payer: Self-pay | Admitting: Obstetrics & Gynecology

## 2022-10-13 ENCOUNTER — Inpatient Hospital Stay (HOSPITAL_COMMUNITY)
Admission: RE | Admit: 2022-10-13 | Discharge: 2022-10-15 | DRG: 807 | Disposition: A | Discharge status: Home / Self Care | Payer: Medicaid Other | Attending: Obstetrics & Gynecology | Admitting: Obstetrics & Gynecology

## 2022-10-13 ENCOUNTER — Other Ambulatory Visit: Payer: Self-pay

## 2022-10-13 ENCOUNTER — Inpatient Hospital Stay (HOSPITAL_COMMUNITY): Payer: Medicaid Other

## 2022-10-13 ENCOUNTER — Inpatient Hospital Stay (HOSPITAL_COMMUNITY): Payer: Medicaid Other | Admitting: Anesthesiology

## 2022-10-13 DIAGNOSIS — Z87891 Personal history of nicotine dependence: Secondary | ICD-10-CM | POA: Diagnosis not present

## 2022-10-13 DIAGNOSIS — Z148 Genetic carrier of other disease: Secondary | ICD-10-CM | POA: Diagnosis not present

## 2022-10-13 DIAGNOSIS — O0993 Supervision of high risk pregnancy, unspecified, third trimester: Secondary | ICD-10-CM

## 2022-10-13 DIAGNOSIS — O24420 Gestational diabetes mellitus in childbirth, diet controlled: Secondary | ICD-10-CM | POA: Diagnosis present

## 2022-10-13 DIAGNOSIS — Z8632 Personal history of gestational diabetes: Secondary | ICD-10-CM | POA: Diagnosis present

## 2022-10-13 DIAGNOSIS — Z3A39 39 weeks gestation of pregnancy: Secondary | ICD-10-CM

## 2022-10-13 DIAGNOSIS — O24410 Gestational diabetes mellitus in pregnancy, diet controlled: Secondary | ICD-10-CM

## 2022-10-13 LAB — GLUCOSE, CAPILLARY
Glucose-Capillary: 89 mg/dL (ref 70–99)
Glucose-Capillary: 76 mg/dL (ref 70–99)
Glucose-Capillary: 86 mg/dL (ref 70–99)
Glucose-Capillary: 85 mg/dL (ref 70–99)
Glucose-Capillary: 86 mg/dL (ref 70–99)
Glucose-Capillary: 58 mg/dL — ABNORMAL LOW (ref 70–99)
Glucose-Capillary: 63 mg/dL — ABNORMAL LOW (ref 70–99)
Glucose-Capillary: 69 mg/dL — ABNORMAL LOW (ref 70–99)
Glucose-Capillary: 115 mg/dL — ABNORMAL HIGH (ref 70–99)

## 2022-10-13 LAB — TYPE AND SCREEN
ABO/RH(D): O POS
Antibody Screen: NEGATIVE

## 2022-10-13 LAB — CBC
HCT: 34.6 % — ABNORMAL LOW (ref 36.0–46.0)
Hemoglobin: 11.7 g/dL — ABNORMAL LOW (ref 12.0–15.0)
MCH: 29 pg (ref 26.0–34.0)
MCHC: 33.8 g/dL (ref 30.0–36.0)
MCV: 85.9 fL (ref 80.0–100.0)
Platelets: 215 10*3/uL (ref 150–400)
RBC: 4.03 MIL/uL (ref 3.87–5.11)
RDW: 14.3 % (ref 11.5–15.5)
WBC: 10.3 10*3/uL (ref 4.0–10.5)
nRBC: 0 % (ref 0.0–0.2)

## 2022-10-13 LAB — RPR: RPR Ser Ql: NONREACTIVE

## 2022-10-13 MED ORDER — DIBUCAINE (PERIANAL) 1 % EX OINT: 1.0000 | TOPICAL_OINTMENT | CUTANEOUS | Status: DC | PRN

## 2022-10-13 MED ORDER — WITCH HAZEL-GLYCERIN EX PADS: 1.0000 | MEDICATED_PAD | CUTANEOUS | Status: DC | PRN

## 2022-10-13 MED ORDER — OXYCODONE-ACETAMINOPHEN 5-325 MG PO TABS: 1.0000 | ORAL_TABLET | ORAL | Status: DC | PRN

## 2022-10-13 MED ORDER — OXYTOCIN-SODIUM CHLORIDE 30-0.9 UT/500ML-% IV SOLN
1.0000 m[IU]/min | INTRAVENOUS | Status: DC
  Administered 2022-10-13: 2 m[IU]/min via INTRAVENOUS

## 2022-10-13 MED ORDER — FENTANYL-BUPIVACAINE-NACL 0.5-0.125-0.9 MG/250ML-% EP SOLN
EPIDURAL | Status: DC | PRN
  Administered 2022-10-13: 12 mL/h via EPIDURAL

## 2022-10-13 MED ORDER — LACTATED RINGERS IV SOLN: INTRAVENOUS | Status: DC

## 2022-10-13 MED ORDER — MISOPROSTOL 25 MCG QUARTER TABLET
25.0000 ug | ORAL_TABLET | Freq: Once | ORAL | Status: AC
  Administered 2022-10-13: 25 ug via VAGINAL
  Filled 2022-10-13: qty 1

## 2022-10-13 MED ORDER — ONDANSETRON HCL 4 MG/2ML IJ SOLN: 4.0000 mg | INTRAMUSCULAR | Status: DC | PRN

## 2022-10-13 MED ORDER — DIPHENHYDRAMINE HCL 25 MG PO CAPS: 25.0000 mg | ORAL_CAPSULE | Freq: Four times a day (QID) | ORAL | Status: DC | PRN

## 2022-10-13 MED ORDER — DIPHENHYDRAMINE HCL 50 MG/ML IJ SOLN: 12.5000 mg | INTRAMUSCULAR | Status: DC | PRN

## 2022-10-13 MED ORDER — ZOLPIDEM TARTRATE 5 MG PO TABS: 5.0000 mg | ORAL_TABLET | Freq: Every evening | ORAL | Status: DC | PRN

## 2022-10-13 MED ORDER — BENZOCAINE-MENTHOL 20-0.5 % EX AERO: 1.0000 | INHALATION_SPRAY | CUTANEOUS | Status: DC | PRN

## 2022-10-13 MED ORDER — PHENYLEPHRINE 80 MCG/ML (10ML) SYRINGE FOR IV PUSH (FOR BLOOD PRESSURE SUPPORT)
80.0000 ug | PREFILLED_SYRINGE | INTRAVENOUS | Status: DC | PRN
  Filled 2022-10-13: qty 10

## 2022-10-13 MED ORDER — FENTANYL-BUPIVACAINE-NACL 0.5-0.125-0.9 MG/250ML-% EP SOLN
12.0000 mL/h | EPIDURAL | Status: DC | PRN
  Filled 2022-10-13: qty 250

## 2022-10-13 MED ORDER — MISOPROSTOL 50MCG HALF TABLET
50.0000 ug | ORAL_TABLET | Freq: Once | ORAL | Status: AC
  Administered 2022-10-13: 50 ug via ORAL
  Filled 2022-10-13: qty 1

## 2022-10-13 MED ORDER — EPHEDRINE 5 MG/ML INJ: 10.0000 mg | INTRAVENOUS | Status: DC | PRN

## 2022-10-13 MED ORDER — LACTATED RINGERS IV SOLN
500.0000 mL | Freq: Once | INTRAVENOUS | Status: AC
  Administered 2022-10-13: 500 mL via INTRAVENOUS

## 2022-10-13 MED ORDER — SENNOSIDES-DOCUSATE SODIUM 8.6-50 MG PO TABS
2.0000 | ORAL_TABLET | Freq: Every day | ORAL | Status: DC
  Administered 2022-10-14 – 2022-10-15 (×2): 2 via ORAL
  Filled 2022-10-13 (×2): qty 2

## 2022-10-13 MED ORDER — OXYTOCIN-SODIUM CHLORIDE 30-0.9 UT/500ML-% IV SOLN
2.5000 [IU]/h | INTRAVENOUS | Status: DC
  Administered 2022-10-13: 2.5 [IU]/h via INTRAVENOUS
  Filled 2022-10-13: qty 500

## 2022-10-13 MED ORDER — COCONUT OIL OIL: 1.0000 | TOPICAL_OIL | Status: DC | PRN

## 2022-10-13 MED ORDER — LIDOCAINE HCL (PF) 1 % IJ SOLN
INTRAMUSCULAR | Status: DC | PRN
  Administered 2022-10-13: 5 mL via EPIDURAL

## 2022-10-13 MED ORDER — ONDANSETRON HCL 4 MG/2ML IJ SOLN
4.0000 mg | Freq: Four times a day (QID) | INTRAMUSCULAR | Status: DC | PRN
  Administered 2022-10-13 (×2): 4 mg via INTRAVENOUS
  Filled 2022-10-13 (×2): qty 2

## 2022-10-13 MED ORDER — TERBUTALINE SULFATE 1 MG/ML IJ SOLN: 0.2500 mg | Freq: Once | INTRAMUSCULAR | Status: DC | PRN

## 2022-10-13 MED ORDER — SIMETHICONE 80 MG PO CHEW: 80.0000 mg | CHEWABLE_TABLET | ORAL | Status: DC | PRN

## 2022-10-13 MED ORDER — FLEET ENEMA 7-19 GM/118ML RE ENEM: 1.0000 | ENEMA | RECTAL | Status: DC | PRN

## 2022-10-13 MED ORDER — LACTATED RINGERS IV SOLN
500.0000 mL | INTRAVENOUS | Status: DC | PRN
  Administered 2022-10-13: 250 mL via INTRAVENOUS
  Administered 2022-10-13: 300 mL via INTRAVENOUS

## 2022-10-13 MED ORDER — FENTANYL CITRATE (PF) 100 MCG/2ML IJ SOLN: 100.0000 ug | INTRAMUSCULAR | Status: DC | PRN

## 2022-10-13 MED ORDER — PRENATAL MULTIVITAMIN CH
1.0000 | ORAL_TABLET | Freq: Every day | ORAL | Status: DC
  Administered 2022-10-14 – 2022-10-15 (×2): 1 via ORAL
  Filled 2022-10-13 (×2): qty 1

## 2022-10-13 MED ORDER — LIDOCAINE HCL (PF) 1 % IJ SOLN: 30.0000 mL | INTRAMUSCULAR | Status: DC | PRN

## 2022-10-13 MED ORDER — OXYTOCIN BOLUS FROM INFUSION
333.0000 mL | Freq: Once | INTRAVENOUS | Status: AC
  Administered 2022-10-13: 333 mL via INTRAVENOUS

## 2022-10-13 MED ORDER — ACETAMINOPHEN 325 MG PO TABS
650.0000 mg | ORAL_TABLET | ORAL | Status: DC | PRN
  Administered 2022-10-13: 650 mg via ORAL
  Filled 2022-10-13: qty 2

## 2022-10-13 MED ORDER — IBUPROFEN 600 MG PO TABS
600.0000 mg | ORAL_TABLET | Freq: Four times a day (QID) | ORAL | Status: DC
  Administered 2022-10-13 – 2022-10-15 (×7): 600 mg via ORAL
  Filled 2022-10-13 (×7): qty 1

## 2022-10-13 MED ORDER — ONDANSETRON HCL 4 MG PO TABS: 4.0000 mg | ORAL_TABLET | ORAL | Status: DC | PRN

## 2022-10-13 MED ORDER — HYDROXYZINE HCL 50 MG PO TABS: 50.0000 mg | ORAL_TABLET | Freq: Four times a day (QID) | ORAL | Status: DC | PRN

## 2022-10-13 MED ORDER — SOD CITRATE-CITRIC ACID 500-334 MG/5ML PO SOLN
30.0000 mL | ORAL | Status: DC | PRN
  Administered 2022-10-13: 30 mL via ORAL
  Filled 2022-10-13: qty 30

## 2022-10-13 MED ORDER — TETANUS-DIPHTH-ACELL PERTUSSIS 5-2.5-18.5 LF-MCG/0.5 IM SUSY: 0.5000 mL | PREFILLED_SYRINGE | Freq: Once | INTRAMUSCULAR | Status: DC

## 2022-10-13 MED ORDER — OXYCODONE-ACETAMINOPHEN 5-325 MG PO TABS: 2.0000 | ORAL_TABLET | ORAL | Status: DC | PRN

## 2022-10-13 MED ORDER — PHENYLEPHRINE 80 MCG/ML (10ML) SYRINGE FOR IV PUSH (FOR BLOOD PRESSURE SUPPORT): 80.0000 ug | PREFILLED_SYRINGE | INTRAVENOUS | Status: DC | PRN

## 2022-10-13 MED ORDER — ACETAMINOPHEN 325 MG PO TABS
650.0000 mg | ORAL_TABLET | ORAL | Status: DC | PRN
  Administered 2022-10-14 – 2022-10-15 (×7): 650 mg via ORAL
  Filled 2022-10-13 (×7): qty 2

## 2022-10-13 NOTE — Progress Notes (Signed)
Hypoglycemic Event  CBG: 58   Treatment: 8 oz juice/soda  Symptoms: Pale, Shaky  Follow-up CBG: Time:1819 CBG Result:63  Possible Reasons for Event: Inadequate meal intake  Comments/MD notified:Danielle Simpson, CNM at bedside and notified    Lenox Ponds, RN

## 2022-10-13 NOTE — Progress Notes (Signed)
Katherine [name] is a 32 y.o. G3P1011 at [redacted]w[redacted]d admitted for IOL in the setting of A1GDM  Subjective: Katherine [name] is doing well. She is comfortable with her epidural. She reports intermittent pelvic pressure.  Objective: BP (!) 90/54   Pulse 70   Temp 98.4 F (36.9 C) (Oral)   Resp 17   Ht 5' 2.5" (1.588 m)   Wt 82.8 kg   LMP 11/29/2021   SpO2 100%   BMI 32.87 kg/m  No intake/output data recorded. Total I/O In: -  Out: 500 [Urine:500]  FHT: 105 bpm, moderate variability, +15x15 accels, +early decels UC: Q 2-4 mins SVE:   Dilation: 7 Effacement (%): 90 Station: 0 Exam by:: Ileana Ladd, RN  Labs: Lab Results  Component Value Date   WBC 10.3 10/13/2022   HGB 11.7 (L) 10/13/2022   HCT 34.6 (L) 10/13/2022   MCV 85.9 10/13/2022   PLT 215 10/13/2022    Assessment / Plan: Katherine [name] Katherine [name] is a 32 y.o. G3P1011 at [redacted]w[redacted]d admitted for IOL in the setting of A1GDM  Labor: Progressing.  A1GDM: Q2H CBGs Fetal Wellbeing:  Category I Pain Control:  Epidural I/D:  GBS neg Anticipated MOD:  NSVD  Brand Males, CNM 10/13/2022, 4:32 PM

## 2022-10-13 NOTE — H&P (Signed)
OBSTETRIC ADMISSION HISTORY AND PHYSICAL  Katherine [name] is a 32 y.o. female G59P1011 with IUP at [redacted]w[redacted]d by [place] presenting for induction of labor A1 GDM. She reports +FMs, No LOF, no VB, no blurry vision, headaches or peripheral edema, and RUQ pain.  She plans on breast feeding. She request to discuss options for birth control outpatient. She received her prenatal [facility] at [facility]   Dating: By [place] --->  Estimated Date of Delivery: 10/16/22  Sono:    @36w , CWD, normal anatomy, ceph presentation, 2876g, 57% EFW   Prenatal History/Complications:  Patient Active Problem List   Diagnosis Date Noted   Gestational diabetes mellitus, class A1 10/13/2022   Gestational diabetes 07/24/2022   Carrier of spinal muscular atrophy 07/21/2022   Encounter for supervision of high risk pregnancy in third trimester, antepartum 04/06/2022   Secondary oligomenorrhea 04/06/2022   Excess body and facial hair 08/01/2019     [facility]  RESULTS  Language English Pap 05/10/22 NILM, -HRHPV  Initiated care at 12wks GC/CT Initial:     -/-       36wks:  -/-  Dating by 70wk [place]    Support person Katherine [name] Genetics NT/IT: neg/neg    AFP:      Panorama: low risk female  BP cuff Has bp cuff Carrier 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    [place]/Hgb Elec   Rhogam N/a    TDaP vaccine 07/21/22 Blood Type --/--/O POS (06/07 0058)  Flu vaccine  Antibody NEG (06/07 0058)  Covid vaccine  HBsAg Negative (11/30 1644)    RPR Non Reactive (03/15 0807)  Anatomy US Nl female "Katherine [name]" Rubella  2.78 (11/30 1644)  Feeding Plan breast HIV Non Reactive (03/15 0807)  Contraception Going to endocrinologist pp for hirsutism/bc recommendations Hep C neg  Circumcision Yes    Pediatrician [facility] A1C/GTT Early:     5.2   26-28wks: 89/226/170  Prenatal Classes       GBS Negative/-- (05/13 1400) neg  [ ]  PCN allergy  BTL Consent     VBAC Consent  PHQ9 & GAD7  [ ] New OB  [ ] 28wks   [ ] 36wks  Waterbirth [ ] Class [ ]  36wkCNM visit/consent       Past Medical  History: Past Medical History:  Diagnosis Date   Bacterial vaginosis    Gestational diabetes    Secondary oligomenorrhea     Past Surgical History: Past Surgical History:  Procedure Laterality Date   TONSILLECTOMY      Obstetrical History: OB History     Gravida  3   Para  1   Term  1   Preterm      AB  1   Living  1      SAB  1   IAB      Ectopic      Multiple  0   Live Births  1           Social History Social History   Socioeconomic History   Marital status: Married    Spouse name: Katherine [name]   Number of children: Not on file   Years of education: Not on file   Highest education level: Not on file  Occupational History   Not on file  Tobacco Use   Smoking status: Former    Types: Cigars, E-cigarettes   Smokeless tobacco: Never   Tobacco comments:    Vaped for 1 year in 2021 to 08/2020  Vaping Use   Vaping Use:  Former   Substances: Nicotine, Flavoring  Substance and Sexual Activity   Alcohol use: Not Currently    Comment: weekly   Drug use: Not Currently    Types: Marijuana    Comment: last used 11/2016   Sexual activity: Yes    Birth control/protection: None  Other Topics Concern   Not on file  Social History Narrative   Not on file   Social Determinants of Health   Financial Resource Strain: Low Risk  (04/06/2022)   Overall Financial Resource Strain (CARDIA)    Difficulty of Paying Living Expenses: Not very hard  Food Insecurity: No Food Insecurity (10/13/2022)   Hunger Vital Sign    Worried About Running Out of Food in the Last Year: Never true    Ran Out of Food in the Last Year: Never true  Transportation Needs: No Transportation Needs (10/13/2022)   PRAPARE - Administrator, Civil Service (Medical): No    Lack of Transportation (Non-Medical): No  Physical Activity: Inactive (04/06/2022)   Exercise Vital Sign    Days of Exercise per Week: 0 days    Minutes of Exercise per Session: 0 min  Stress: No Stress Concern  Present (04/06/2022)   [employer] of Occupational Health - Occupational Stress Questionnaire    Feeling of Stress : Not at all  Social Connections: Moderately Isolated (04/06/2022)   Social Connection and Isolation Panel [NHANES]    Frequency of Communication with Friends and Family: More than three times a week    Frequency of Social Gatherings with Friends and Family: Once a week    Attends Religious Services: Never    Database administrator or Organizations: No    Attends Engineer, structural: Never    Marital Status: Married    Family History: Family History  Problem Relation Age of Onset   Diabetes Mother    Cancer Mother    Vision loss Father    Asthma Brother    ADD / ADHD Brother    Drug abuse Brother    Seizures Maternal Uncle    Heart disease Paternal Aunt    Heart attack Maternal Grandmother    Alcohol abuse Paternal Grandfather    Mental retardation Cousin     Allergies: Allergies  Allergen Reactions   Sulfa Antibiotics Swelling    Medications Prior to Admission  Medication Sig Dispense Refill Last Dose   Accu-Chek Softclix Lancets lancets Use as instructed to check blood sugar 4 times daily 100 each 12    acetaminophen (TYLENOL) 500 MG tablet Take 500 mg by mouth as needed for moderate pain.      Blood Glucose Monitoring Suppl (ACCU-CHEK GUIDE ME) w/Device KIT 1 each by Does not apply route 4 (four) times daily. 1 kit 0    Calcium Carbonate Antacid (TUMS PO) Take 1 tablet by mouth daily as needed (heartburn). (Patient not taking: Reported on 10/10/2022)      cyclobenzaprine (FLEXERIL) 10 MG tablet Take 1 tablet (10 mg total) by mouth 3 (three) times daily as needed for muscle spasms. (Patient taking differently: Take 5 mg by mouth 3 (three) times daily as needed for muscle spasms.) 5 tablet 0    fluticasone (FLONASE) 50 MCG/ACT nasal spray       glucose blood (ACCU-CHEK GUIDE) test strip Use as instructed to check blood sugar 4 times daily 50 each  12    levocetirizine (XYZAL) 5 MG tablet SMARTSIG:1 Tablet(s) By Mouth Every Evening  omeprazole (PRILOSEC) 20 MG capsule Take 1 capsule (20 mg total) by mouth daily. 1 tablet a day 30 capsule 6    Prenatal Vit-Fe Fumarate-FA (WESTAB PLUS) 27-1 MG TABS TAKE 1 TABLET BY MOUTH EVERY DAY AT 12 NOON 30 tablet 12      Review of Systems   All systems reviewed and negative except as stated in HPI  Blood pressure 106/68, pulse 88, temperature 97.8 F (36.6 C), temperature source Oral, resp. rate 18, height 5' 2.5" (1.588 m), weight 82.8 kg, last menstrual period 11/29/2021. General appearance: alert, cooperative, and appears stated age Lungs: clear to auscultation bilaterally Heart: regular rate and rhythm Abdomen: soft, non-tender; bowel sounds normal Pelvic: no  lesions Extremities: Homans sign is negative, no sign of DVT  Presentation: cephalic Fetal monitoringBaseline: 140 bpm, Variability: Good {> 6 bpm), Accelerations: Reactive, and Decelerations: Absent Uterine activityirregular Dilation: 2.5 Effacement (%): 60 Station: -3 Exam by:: Katherine Mallory, RN   Prenatal labs: ABO, Rh: --/--/O POS (06/07 1610) Antibody: NEG (06/07 0058) Rubella: 2.78 (11/30 1644) RPR: Non Reactive (03/15 0807)  HBsAg: Negative (11/30 1644)  HIV: Non Reactive (03/15 0807)  GBS: Negative/-- (05/13 1400)  1 hr Glucola nml Genetic screening  LR Anatomy US nml  Prenatal Transfer Tool  Maternal Diabetes: No Genetic Screening: Normal Maternal Ultrasounds/Referrals: Normal Fetal Ultrasounds or other Referrals:  None Maternal Substance Abuse:  No Significant Maternal Medications:  None Significant Maternal Lab Results:  Group B Strep negative Number of Prenatal Visits:greater than 3 verified prenatal visits Other Comments:  None  Results for orders placed or performed during the hospital encounter of 10/13/22 (from the past 24 hour(s))  Type and screen   Collection Time: 10/13/22 12:58 AM   Result Value Ref Range   ABO/RH(D) O POS    Antibody Screen NEG    Sample Expiration      10/16/2022,2359 Performed at [facility], [address], [place], [place] [address]   CBC   Collection Time: 10/13/22 12:59 AM  Result Value Ref Range   WBC 10.3 4.0 - 10.5 K/uL   RBC 4.03 3.87 - 5.11 MIL/uL   Hemoglobin 11.7 (L) 12.0 - 15.0 g/dL   HCT 40.9 (L) 81.1 - 91.4 %   MCV 85.9 80.0 - 100.0 fL   MCH 29.0 26.0 - 34.0 pg   MCHC 33.8 30.0 - 36.0 g/dL   RDW 78.2 95.6 - 21.3 %   Platelets 215 150 - 400 K/uL   nRBC 0.0 0.0 - 0.2 %  Glucose, capillary   Collection Time: 10/13/22  1:28 AM  Result Value Ref Range   Glucose-Capillary 89 70 - 99 mg/dL    Patient Active Problem List   Diagnosis Date Noted   Gestational diabetes mellitus, class A1 10/13/2022   Gestational diabetes 07/24/2022   Carrier of spinal muscular atrophy 07/21/2022   Encounter for supervision of high risk pregnancy in third trimester, antepartum 04/06/2022   Secondary oligomenorrhea 04/06/2022   Excess body and facial hair 08/01/2019    Assessment/Plan:  Katherine [name] is a 32 y.o. G3P1011 at [redacted]w[redacted]d here for IOL A1GDM  #Labor:cyotec #Pain: Per pt request #FWB: CAT 1 #ID:  GBS neg #MOF: Breast #MOC:Discuss op #Circ:  YES  Myrtie Hawk, DO  10/13/2022, 3:52 AM

## 2022-10-13 NOTE — Progress Notes (Signed)
Hypoglycemic Event  CBG: 69  Treatment: 8 oz juice/soda  Symptoms: Shaky  Follow-up CBG: Time: 1829 CBG Result: 69  Possible Reasons for Event: Inadequate meal intake  Comments/MD notified:Danielle Lodema Hong, CNM at bedside and aware  Lenox Ponds, RN   [place] [employer]

## 2022-10-13 NOTE — Anesthesia Preprocedure Evaluation (Signed)
Anesthesia Evaluation  Patient identified by MRN, date of birth, ID band Patient awake    Reviewed: Allergy & Precautions, NPO status , Patient's Chart, lab work & pertinent test results  Airway Mallampati: II  TM Distance: >3 FB Neck ROM: Full    Dental no notable dental hx. (+) Teeth Intact, Dental Advisory Given   Pulmonary former smoker   Pulmonary exam normal breath sounds clear to auscultation       Cardiovascular negative cardio ROS Normal cardiovascular exam Rhythm:Regular Rate:Normal     Neuro/Psych    GI/Hepatic negative GI ROS, Neg liver ROS,,,  Endo/Other  diabetes    Renal/GU      Musculoskeletal   Abdominal   Peds  Hematology Lab Results      Component                Value               Date                      WBC                      10.3                10/13/2022                HGB                      11.7 (L)            10/13/2022                HCT                      34.6 (L)            10/13/2022                MCV                      85.9                10/13/2022                PLT                      215                 10/13/2022              Anesthesia Other Findings All: sulfa  Reproductive/Obstetrics (+) Pregnancy                             Anesthesia Physical Anesthesia Plan  ASA: 3  Anesthesia Plan: Epidural   Post-op Pain Management:    Induction:   PONV Risk Score and Plan:   Airway Management Planned:   Additional Equipment:   Intra-op Plan:   Post-operative Plan:   Informed Consent: I have reviewed the patients History and Physical, chart, labs and discussed the procedure including the risks, benefits and alternatives for the proposed anesthesia with the patient or authorized representative who has indicated his/her understanding and acceptance.       Plan Discussed with:   Anesthesia Plan Comments: (39.4 Wk G3P1 w gDM for  LEA)       Anesthesia Quick  Evaluation

## 2022-10-13 NOTE — Progress Notes (Signed)
Katherine [name] is a 32 y.o. G3P1011 at [redacted]w[redacted]d admitted for IOL in the setting of A1GDM  Subjective: Katherine [name] is doing well. She is comfortable with her epidural. She is requesting AROM. Family is present at bedside.   Objective: BP 113/70   Pulse 76   Temp 97.9 F (36.6 C) (Oral)   Resp 17   Ht 5' 2.5" (1.588 m)   Wt 82.8 kg   LMP 11/29/2021   SpO2 98%   BMI 32.87 kg/m  No intake/output data recorded. No intake/output data recorded.  FHT: 125 bpm, moderate variability, +15x15 accels, no decels UC: Q 2-5 mins SVE:   Dilation: 3.5 Effacement (%): 60, 70 Station: -2 Exam by:: Camelia Eng, CNM  Labs: Lab Results  Component Value Date   WBC 10.3 10/13/2022   HGB 11.7 (L) 10/13/2022   HCT 34.6 (L) 10/13/2022   MCV 85.9 10/13/2022   PLT 215 10/13/2022    Assessment / Plan: Katherine [name] is a 32 y.o. G3P1011 at [redacted]w[redacted]d admitted for IOL in the setting of A1GDM  Labor: AROM with moderate amount of clear fluid. Patient and fetus tolerated well. Continue Pitocin titration prn. A1GDM: Q4H CBGs Fetal Wellbeing:  Category I Pain Control:  Epidural I/D:  GBS neg Anticipated MOD:  NSVD  Brand Males, CNM 10/13/2022, 1:04 PM

## 2022-10-13 NOTE — Anesthesia Procedure Notes (Signed)
Epidural Patient location during procedure: OB Start time: 10/13/2022 11:43 AM End time: 10/13/2022 11:56 AM  Staffing Anesthesiologist: Trevor Iha, MD Performed: anesthesiologist   Preanesthetic Checklist Completed: patient identified, IV checked, site marked, risks and benefits discussed, surgical consent, monitors and equipment checked, pre-op evaluation and timeout performed  Epidural Patient position: sitting Prep: DuraPrep and site prepped and draped Patient monitoring: continuous pulse ox and blood pressure Approach: midline Location: L3-L4 Injection technique: LOR air  Needle:  Needle type: Tuohy  Needle gauge: 17 G Needle length: 9 cm and 9 Needle insertion depth: 6 cm Catheter type: closed end flexible Catheter size: 19 Gauge Catheter at skin depth: 12 cm Test dose: negative  Assessment Events: blood not aspirated, no cerebrospinal fluid, injection not painful, no injection resistance, no paresthesia and negative IV test  Additional Notes Patient identified. Risks/Benefits/Options discussed with patient including but not limited to bleeding, infection, nerve damage, paralysis, failed block, incomplete pain control, headache, blood pressure changes, nausea, vomiting, reactions to medication both or allergic, itching and postpartum back pain. Confirmed with bedside nurse the patient's most recent platelet count. Confirmed with patient that they are not currently taking any anticoagulation, have any bleeding history or any family history of bleeding disorders. Patient expressed understanding and wished to proceed. All questions were answered. Sterile technique was used throughout the entire procedure. Please see nursing notes for vital signs. Test dose was given through epidural needle and negative prior to continuing to dose epidural or start infusion. Warning signs of high block given to the patient including shortness of breath, tingling/numbness in hands, complete  motor block, or any concerning symptoms with instructions to call for help. Patient was given instructions on fall risk and not to get out of bed. All questions and concerns addressed with instructions to call with any issues.  1Attempt (S) . Patient tolerated procedure well.

## 2022-10-13 NOTE — Plan of Care (Signed)
  Problem: Education: Goal: Knowledge of Childbirth will improve Outcome: Adequate for Discharge Goal: Ability to make informed decisions regarding treatment and plan of care will improve Outcome: Adequate for Discharge Goal: Ability to state and carry out methods to decrease the pain will improve Outcome: Adequate for Discharge Goal: Individualized Educational Video(s) Outcome: Not Applicable   Problem: Coping: Goal: Ability to verbalize concerns and feelings about labor and delivery will improve Outcome: Adequate for Discharge   Problem: Life Cycle: Goal: Ability to make normal progression through stages of labor will improve Outcome: Completed/Met Goal: Ability to effectively push during vaginal delivery will improve Outcome: Completed/Met   Problem: Role Relationship: Goal: Will demonstrate positive interactions with the child Outcome: Adequate for Discharge   Problem: Safety: Goal: Risk of complications during labor and delivery will decrease Outcome: Adequate for Discharge   Problem: Pain Management: Goal: Relief or control of pain from uterine contractions will improve Outcome: Adequate for Discharge   

## 2022-10-13 NOTE — Progress Notes (Signed)
Hypoglycemic Event  CBG: 69  Treatment: 8 oz juice/soda  Symptoms: None  Follow-up CBG: Time:1908 CBG Result:115  Possible Reasons for Event: Inadequate meal intake  Comments/MD notified:Danielle Lodema Hong, CNM at bedside    [place] M Zyaire Mccleod

## 2022-10-13 NOTE — Discharge Summary (Signed)
Postpartum Discharge Summary  Date of Service updated***     Patient Name: Katherine [name] DOB: October 25, 1990 MRN: [id number]  Date of admission: 10/13/2022 Delivery date:10/13/2022  Delivering provider: Brand Males  Date of discharge: 10/13/2022  Admitting diagnosis: Gestational diabetes mellitus, class A1 [O24.410] Intrauterine pregnancy: [redacted]w[redacted]d     Secondary diagnosis:  Principal Problem:   Gestational diabetes mellitus, class A1 Active Problems:   Encounter for supervision of high risk pregnancy in third trimester, antepartum   Carrier of spinal muscular atrophy   SVD (spontaneous vaginal delivery)  Additional problems: ***    Discharge diagnosis: Term Pregnancy Delivered and GDM A1                                              Post partum procedures:{Postpartum procedures:23558} Augmentation: AROM, Pitocin, and Cytotec Complications: None  Hospital course: Induction of Labor With Vaginal Delivery   32 y.o. yo G3P1011 at [redacted]w[redacted]d was admitted to the hospital 10/13/2022 for induction of labor.  Indication for induction: A1 DM.  Patient had an labor course complicated by none Membrane Rupture Time/Date: 12:52 PM ,10/13/2022   Delivery Method:Vaginal, Spontaneous  Episiotomy: None  Lacerations:  None  Details of delivery can be found in separate delivery note.  Patient had a postpartum course complicated by***. Patient is discharged home 10/13/22.  Newborn Data: Birth date:10/13/2022  Birth time:7:29 PM  Gender:Female  Living status:Living  Apgars:8 ,9  Weight:   Magnesium Sulfate received: {Mag received:30440022} BMZ received: No Rhophylac:N/A MMR:{MMR:30440033} T-DaP:{Tdap:23962} Flu: {OZD:66440} Transfusion:{Transfusion received:30440034}  Physical exam  Vitals:   10/13/22 1731 10/13/22 1807 10/13/22 1830 10/13/22 1901  BP: 108/61 99/73 103/64 129/66  Pulse: 76 91 (!) 124 95  Resp:   18   Temp:      TempSrc:      SpO2:  100% 100%   Weight:      Height:        General: {Exam; general:21111117} Lochia: {Desc; appropriate/inappropriate:30686::"appropriate"} Uterine Fundus: {Desc; firm/soft:30687} Incision: {Exam; incision:21111123} DVT Evaluation: {Exam; dvt:2111122} Labs: Lab Results  Component Value Date   WBC 10.3 10/13/2022   HGB 11.7 (L) 10/13/2022   HCT 34.6 (L) 10/13/2022   MCV 85.9 10/13/2022   PLT 215 10/13/2022      Latest Ref Rng & Units 03/26/2021   10:50 AM  CMP  Glucose 70 - 99 mg/dL 347   BUN 6 - 20 mg/dL 8   Creatinine 4.25 - 9.56 mg/dL 3.87   Sodium 564 - 332 mmol/L 139   Potassium 3.5 - 5.1 mmol/L 3.9   Chloride 98 - 111 mmol/L 106   CO2 22 - 32 mmol/L 26   Calcium 8.9 - 10.3 mg/dL 9.4    Edinburgh Score:    08/28/2017    2:13 PM  Edinburgh Postnatal Depression Scale Screening Tool  I have been able to laugh and see the funny side of things. 0  I have looked forward with enjoyment to things. 0  I have blamed myself unnecessarily when things went wrong. 1  I have been anxious or worried for no good reason. 1  I have felt scared or panicky for no good reason. 1  Things have been getting on top of me. 1  I have been so unhappy that I have had difficulty sleeping. 0  I have felt sad or miserable. 0  I have been so unhappy that I have been crying. 0  The thought of harming myself has occurred to me. 0  Edinburgh Postnatal Depression Scale Total 4     After visit meds:  Allergies as of 10/13/2022       Reactions   Sulfa Antibiotics Swelling     Med Rec must be completed prior to using this [facility]***        Discharge home in stable condition Infant Feeding: {Baby feeding:23562} Infant Disposition:{CHL IP OB HOME WITH ZOXWRU:04540} Discharge instruction: per After Visit Summary and Postpartum booklet. Activity: Advance as tolerated. Pelvic rest for 6 weeks.  Diet: {OB JWJX:91478295} Future Appointments:No future appointments. Follow up Visit:  Message sent on 6/7  Please schedule this  patient for a In person postpartum visit in 6 weeks with the following provider: Any provider. Additional Postpartum F/U: 2 hour GTT  Low risk pregnancy complicated by: GDM Delivery mode:  Vaginal, Spontaneous  Anticipated Birth Control:   discuss outpatient   10/13/2022 Brand Males, CNM

## 2022-10-14 LAB — GLUCOSE, CAPILLARY: Glucose-Capillary: 138 mg/dL — ABNORMAL HIGH (ref 70–99)

## 2022-10-14 MED ORDER — CALCIUM CARBONATE ANTACID 500 MG PO CHEW
1.0000 | CHEWABLE_TABLET | ORAL | Status: DC | PRN
  Administered 2022-10-14 (×2): 200 mg via ORAL
  Filled 2022-10-14 (×2): qty 1

## 2022-10-14 MED ORDER — OXYCODONE HCL 5 MG PO TABS
5.0000 mg | ORAL_TABLET | Freq: Once | ORAL | Status: AC
  Administered 2022-10-14: 5 mg via ORAL
  Filled 2022-10-14: qty 1

## 2022-10-14 NOTE — Lactation Note (Signed)
This note was copied from a baby's chart. Lactation Consultation Note  Patient Name: Katherine [name] [name] [id number] Date: 10/14/2022 Age:31 hours  Birthing parent states she is going to take a nap upon visit. LC will come back to room at another time as possible.    Angeletta Goelz A Higuera Ancidey 10/14/2022, 11:03 AM

## 2022-10-14 NOTE — Progress Notes (Signed)
Circumcision Consent  Discussed with mom at bedside about circumcision.   Circumcision is a surgery that removes the skin that covers the tip of the penis, called the "foreskin." Circumcision is usually done when a boy is between 59 and 70 days old, sometimes up to 66-11 weeks old.  The most common reasons boys are circumcised include for cultural/religious beliefs or for parental preference (potentially easier to clean, so baby looks like daddy, etc).  There may be some medical benefits for circumcision:   Circumcised boys seem to have slightly lower rates of: ? Urinary tract infections (per the American Academy of Pediatrics an uncircumcised boy has a 1/100 chance of developing a UTI in the first year of life, a circumcised boy at a 05/998 chance of developing a UTI in the first year of life- a 10% reduction) ? Penis cancer (typically rare- an uncircumcised female has a 1 in 100,000 chance of developing cancer of the penis) ? Sexually transmitted infection (in endemic areas, including HIV, HPV and Herpes- circumcision does NOT protect against gonorrhea, chlamydia, trachomatis, or syphilis) ? Phimosis: a condition where that makes retraction of the foreskin over the glans impossible (0.4 per 1000 boys per year or 0.6% of boys are affected by their 15th birthday)  Boys and men who are not circumcised can reduce these extra risks by: ? Cleaning their penis well ? Using condoms during sex  What are the risks of circumcision?  As with any surgical procedure, there are risks and complications. In circumcision, complications are rare and usually minor, the most common being: ? Bleeding- risk is reduced by holding each clamp for 30 seconds prior to a cut being made, and by holding pressure after the procedure is done ? Infection- the penis is cleaned prior to the procedure, and the procedure is done under sterile technique ? Damage to the urethra or amputation of the penis  How is circumcision done  in baby boys?  The baby will be placed on a special table and the legs restrained for their safety. Numbing medication is injected into the penis, and the skin is cleansed with betadine to decrease the risk of infection.   What to expect:  The penis will look red and raw for 5-7 days as it heals. We expect scabbing around where the cut was made, as well as clear-pink fluid and some swelling of the penis right after the procedure. If your baby's circumcision starts to bleed or develops pus, please contact your pediatrician immediately.  All questions were answered and mother consented.  Celedonio Savage, MD 7:31 AM

## 2022-10-14 NOTE — Anesthesia Postprocedure Evaluation (Signed)
Anesthesia Post Note  Patient: Katherine [name]  Procedure(s) Performed: AN AD HOC LABOR EPIDURAL     Patient location during evaluation: Mother Baby Anesthesia Type: Epidural Level of consciousness: awake and alert and oriented Pain management: satisfactory to patient Vital Signs Assessment: post-procedure vital signs reviewed and stable Respiratory status: respiratory function stable Cardiovascular status: stable Postop Assessment: no headache, no backache, epidural receding, patient able to bend at knees, no signs of nausea or vomiting, adequate PO intake and able to ambulate Anesthetic complications: no   No notable events documented.  Last Vitals:  Vitals:   10/14/22 0238 10/14/22 0610  BP: 113/60 112/68  Pulse: 69 72  Resp: 18 18  Temp: 36.8 C 36.8 C  SpO2: 100% 100%    Last Pain:  Vitals:   10/14/22 0610  TempSrc: Oral  PainSc:    Pain Goal:                   Mandie Crabbe

## 2022-10-14 NOTE — Progress Notes (Signed)
POSTPARTUM PROGRESS NOTE  Post Partum Day 1  Subjective:  Katherine [name] is a 32 y.o. [id number] s/p SVD at [redacted]w[redacted]d.  She reports she is doing well. No acute events overnight. She denies any problems with ambulating, voiding or po intake. Denies nausea or vomiting.  Pain is moderatly controlled.  Lochia is appropriate.  Objective: Blood pressure 112/68, pulse 72, temperature 98.2 F (36.8 C), temperature source Oral, resp. rate 18, height 5' 2.5" (1.588 m), weight 82.8 kg, last menstrual period 11/29/2021, SpO2 100 %, unknown if currently breastfeeding.  Physical Exam:  General: alert, cooperative and no distress Chest: no respiratory distress Heart:regular rate, distal pulses intact Abdomen: soft, nontender,  Uterine Fundus: firm, appropriately tender DVT Evaluation: No calf swelling or tenderness Extremities: trace edema Skin: warm, dry  Recent Labs    10/13/22 0059  HGB 11.7*  HCT 34.6*    Assessment/Plan: Katherine [name] is a 32 y.o. [id number] s/p SVD at 101w4d   PPD#1 - Doing well  Routine postpartum care  Contraception: will discuss OP Feeding: breast Dispo: Plan for discharge tomorrow.   LOS: 1 day   Derrel Nip, MD  OB Fellow  10/14/2022, 7:32 AM

## 2022-10-15 MED ORDER — ACETAMINOPHEN 325 MG PO TABS: 650.0000 mg | ORAL_TABLET | ORAL | 0 refills | Status: AC | PRN

## 2022-10-15 MED ORDER — BENZOCAINE-MENTHOL 20-0.5 % EX AERO: 1.0000 | INHALATION_SPRAY | CUTANEOUS | 0 refills | Status: DC | PRN

## 2022-10-15 MED ORDER — IBUPROFEN 600 MG PO TABS: 600.0000 mg | ORAL_TABLET | Freq: Four times a day (QID) | ORAL | 0 refills | Status: DC

## 2022-11-02 ENCOUNTER — Telehealth (HOSPITAL_COMMUNITY): Payer: Self-pay | Admitting: *Deleted

## 2022-11-02 NOTE — Telephone Encounter (Signed)
11/02/2022  Name: Katherine [name] MRN: [id number] DOB: 13-May-1990  Reason for Call:  Transition of Care Hospital Discharge Call  Contact Status: Patient Contact Status: Complete  Language assistant needed:          Follow-Up Questions: Do You Have Any Concerns About Your Health As You Heal From Delivery?: No Do You Have Any Concerns About Your Infants Health?: No Patient asked if it was normal for her breasts to still be leaking milk. Patient reported exclusively formula feeding infant. RN reassured patient that this is normal. Instructed patient on symptoms of infection to report to MD. Patient verbalized understanding. Edinburgh Postnatal Depression Scale:  In the Past 7 Days:   EPDS not done at this time. Patient reported having completed EPDS at a pediatric appointment since discharge. Patient unable to recall her score, but stated, "I'm fine." PHQ2-9 Depression Scale:     Discharge Follow-up: Edinburgh score requires follow up?: N/A Patient was advised of the following resources:: Support Group  Post-discharge interventions: Reviewed Newborn Safe Sleep Practices  Signature Deforest Hoyles, RN, [phone]

## 2022-11-16 ENCOUNTER — Other Ambulatory Visit: Payer: Medicaid Other

## 2022-11-16 ENCOUNTER — Ambulatory Visit: Payer: Medicaid Other | Admitting: Advanced Practice Midwife

## 2022-11-16 DIAGNOSIS — Z131 Encounter for screening for diabetes mellitus: Secondary | ICD-10-CM

## 2022-11-16 DIAGNOSIS — Z8632 Personal history of gestational diabetes: Secondary | ICD-10-CM

## 2022-12-23 ENCOUNTER — Encounter: Payer: Self-pay | Admitting: Women's Health

## 2022-12-27 ENCOUNTER — Encounter: Payer: Self-pay | Admitting: Advanced Practice Midwife

## 2022-12-27 ENCOUNTER — Other Ambulatory Visit: Payer: Medicaid Other

## 2022-12-27 ENCOUNTER — Ambulatory Visit: Payer: Medicaid Other | Admitting: Women's Health

## 2022-12-27 ENCOUNTER — Ambulatory Visit (INDEPENDENT_AMBULATORY_CARE_PROVIDER_SITE_OTHER): Payer: Medicaid Other | Admitting: Advanced Practice Midwife

## 2022-12-27 NOTE — Patient Instructions (Signed)
Use the website www.postpartum.net for helpful postpartum resources; also use the Lact Med website to determine safe medications to take with breastfeeding.

## 2022-12-27 NOTE — Progress Notes (Signed)
POSTPARTUM VISIT Patient name: Katherine [name] MRN [id number]  Date of birth: [birth year] Chief Complaint:   Postpartum Care  History of Present Illness:   Katherine [name] is a 32 y.o. G33P2012 Caucasian female being seen today for a postpartum visit. She is  11  weeks postpartum following a spontaneous vaginal delivery at 39.4 gestational weeks. IOL: yes, for diabetes mellitus A1DM. Anesthesia: epidural.  Laceration: none.  Complications: none. Inpatient contraception: no.   Pregnancy complicated by GDMA1 . Tobacco use: former . Substance use disorder: no. Last pap smear: Jan 2024 and results were NILM w/ HRHPV negative. Next pap smear due: Jan 2027 Patient's last menstrual period was 12/26/2022 (exact date).  Postpartum course has been uncomplicated. Bleeding flow about like a period. Bowel function is normal. Bladder function is normal. Urinary incontinence? yes occ stress incontinence w sneezing/laughing; sometimes incomplete emptying; has seen pelvic floor PT prior and has info to reconnect w her , fecal incontinence? no Patient is sexually active. Last sexual activity:  didn't ask; having cycle now . Desired contraception: COCs. Patient does not know about a pregnancy in the future.  Desired family size is unsure number of children.   Upstream - 12/27/22 0841       Pregnancy Intention Screening   Does the patient want to become pregnant in the next year? No    Does the patient's partner want to become pregnant in the next year? No    Would the patient like to discuss contraceptive options today? No      Contraception Wrap Up   Current Method Female Condom    End Method Oral Contraceptive    Contraception Counseling Provided Yes            The pregnancy intention screening data noted above was reviewed. Potential methods of contraception were discussed. The patient elected to proceed with Oral Contraceptive.  Edinburgh Postpartum Depression Screening: negative  Edinburgh Postnatal  Depression Scale - 12/27/22 0830       Edinburgh Postnatal Depression Scale:  In the Past 7 Days   I have been able to laugh and see the funny side of things. 0    I have looked forward with enjoyment to things. 0    I have blamed myself unnecessarily when things went wrong. 0    I have been anxious or worried for no good reason. 0    I have felt scared or panicky for no good reason. 0    Things have been getting on top of me. 1    I have been so unhappy that I have had difficulty sleeping. 0    I have felt sad or miserable. 0    I have been so unhappy that I have been crying. 0    The thought of harming myself has occurred to me. 0    Edinburgh Postnatal Depression Scale Total 1                07/21/2022    8:31 AM 04/06/2022    3:17 PM 06/04/2020   11:17 AM  GAD 7 : Generalized Anxiety Score  Nervous, Anxious, on Edge 0 1 1  Control/stop worrying 0 0 2  Worry too much - different things 1 0 2  Trouble relaxing 1 1 1   Restless 1 0 1  Easily annoyed or irritable 1 2 1   Afraid - awful might happen 0 0 0  Total GAD 7 Score 4 4 8   Anxiety Difficulty Not difficult  at all  Not difficult at all     Baby's course has been uncomplicated. Baby is feeding by bottle. Infant has a pediatrician/family doctor? Yes.  Childcare strategy if returning to work/school: n/a-stay at home mom.  Pt has material needs met for her and baby: Yes.   Review of Systems:   Pertinent items are noted in HPI Denies Abnormal vaginal discharge w/ itching/odor/irritation, headaches, visual changes, shortness of breath, chest pain, abdominal pain, severe nausea/vomiting, or problems with urination or bowel movements. Pertinent History Reviewed:  Reviewed past medical,surgical, obstetrical and family history.  Reviewed problem list, medications and allergies. OB History  Gravida Para Term Preterm AB Living  3 2 2   1 2   SAB IAB Ectopic Multiple Live Births  1     0 2    # Outcome Date GA Lbr Len/2nd  Weight Sex Type Anes PTL Lv  3 Term 10/13/22 [redacted]w[redacted]d 04:45 / 01:52 8 lb (3.63 kg) M Vag-Spont EPI  LIV  2 SAB 2022          1 Term 07/18/17 [redacted]w[redacted]d 18:01 / 02:19 9 lb 5 oz (4.225 kg) M Vag-Spont EPI N LIV     Birth Comments: Molding noted on baby's head   Physical Assessment:   Vitals:   12/27/22 0838  BP: 107/68  Pulse: 68  Weight: 174 lb (78.9 kg)  Height: 5' 2.5" (1.588 m)  Body mass index is 31.32 kg/m.       Physical Examination:   General appearance: alert, well appearing, and in no distress  Mental status: alert, oriented to person, place, and time  Skin: warm & dry   Cardiovascular: normal heart rate noted   Respiratory: normal respiratory effort, no distress   Breasts: deferred, no complaints   Abdomen: soft, non-tender   Pelvic: examination not indicated. Thin prep pap obtained: No  Rectal: not examined  Extremities: Edema: none          No results found for this or any previous visit (from the past 24 hour(s)).  Assessment & Plan:  1) Postpartum exam 2) Eleven wks s/p spontaneous vaginal delivery 3) bottle feeding 4) Depression screening> negative 5) Contraception counseling> got rx for OCPs from endocrinologist where she is being evaluated for hirsuitism 6) Hx A1GDM, HgbA1c w endocrine was 5.5 last week; doesn't need to do GTT  Essential components of care per ACOG recommendations:  1.  Mood and well being:  If positive depression screen, discussed and plan developed.  If using tobacco we discussed reduction/cessation and risk of relapse If current substance abuse, we discussed and referral to local resources was offered.   2. Infant care and feeding:  If breastfeeding, discussed returning to work, pumping, breastfeeding-associated pain, guidance regarding return to fertility while lactating if not using another method. If needed, patient was provided with a letter to be allowed to pump q 2-3hrs to support lactation in a private location with access to a  refrigerator to store breastmilk.   Recommended that all caregivers be immunized for flu, pertussis and other preventable communicable diseases If pt does not have material needs met for her/baby, referred to local resources for help obtaining these.  3. Sexuality, contraception and birth spacing Provided guidance regarding sexuality, management of dyspareunia, and resumption of intercourse Discussed avoiding interpregnancy interval <12mths and recommended birth spacing of 18 months  4. Sleep and fatigue Discussed coping options for fatigue and sleep disruption Encouraged family/partner/community support of 4 hrs of uninterrupted sleep to help with  mood and fatigue  5. Physical recovery  If pt had a C/S, assessed incisional pain and providing guidance on normal vs prolonged recovery If pt had a laceration, perineal healing and pain reviewed.  If urinary or fecal incontinence, discussed management and referred to PT or uro/gyn if indicated  Patient is safe to resume physical activity. Discussed attainment of healthy weight.  6.  Chronic disease management Discussed pregnancy complications if any, and their implications for future childbearing and long-term maternal health. Review recommendations for prevention of recurrent pregnancy complications, such as 17 hydroxyprogesterone caproate to reduce risk for recurrent PTB not applicable, or aspirin to reduce risk of preeclampsia not applicable. Pt had GDM: yes. If yes, 2hr GTT scheduled: had HgbA1c w endocrinologist: 5.5. Reviewed medications and non-pregnant dosing including consideration of whether pt is breastfeeding using a reliable resource such as LactMed: not applicable Referred for f/u w/ PCP or subspecialist providers as indicated: has PCP at [facility]  7. Health maintenance Mammogram at 31yo or earlier if indicated Pap smears as indicated  Meds: No orders of the defined types were placed in this encounter.   Follow-up:  Return for call back list for Pap 2027.   No orders of the defined types were placed in this encounter.   Arabella Merles CNM 12/27/2022 9:03 AM

## 2023-02-21 ENCOUNTER — Encounter: Payer: Self-pay | Admitting: Women's Health

## 2023-02-22 IMAGING — US US OB < 14 WEEKS - US OB TV
1 series · 13 of 28 positions shown · non-contrast
Comparison: None.

CLINICAL DATA: Cramping, bleeding.  Beta HCG of 1,877

EXAM:
OBSTETRIC <14 WK US AND TRANSVAGINAL OB US
TECHNIQUE: Both transabdominal and transvaginal ultrasound examinations were
performed for complete evaluation of the gestation as well as the
maternal uterus, adnexal regions, and pelvic cul-de-sac.
Transvaginal technique was performed to assess early pregnancy.

[Series 1: us ob less than 14 weeks with ob transvaginal · 13 of 98 slices shown]
[im 4/98]
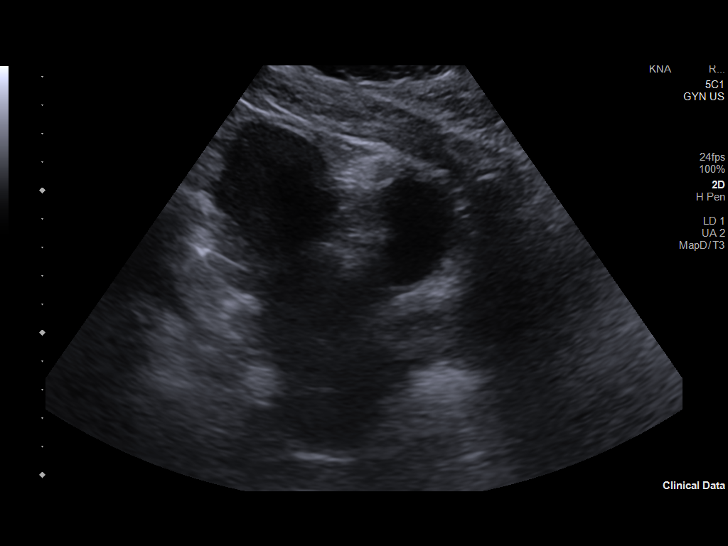
[im 11/98]
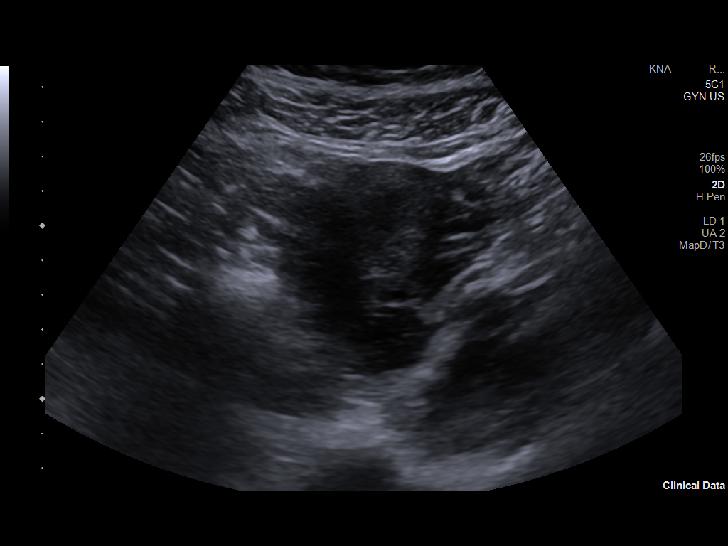
[im 18/98]
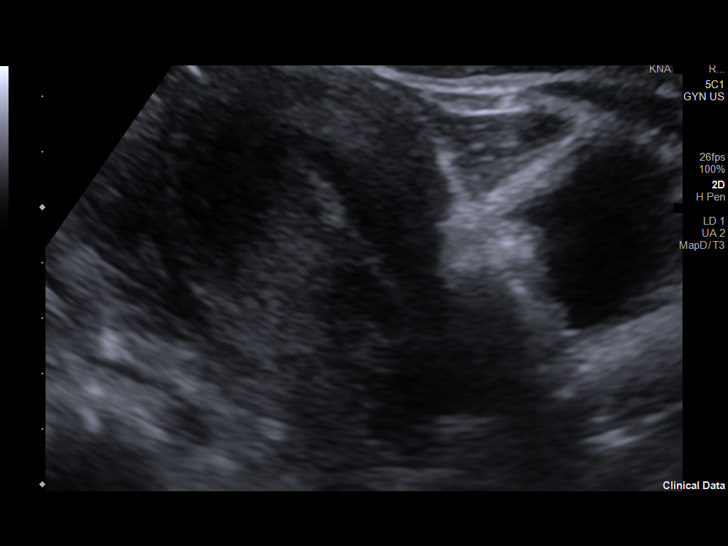
[im 26/98]
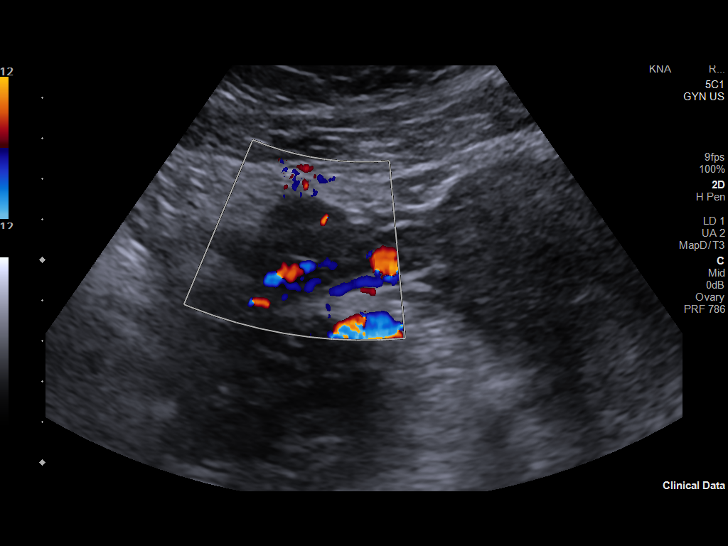
[im 33/98]
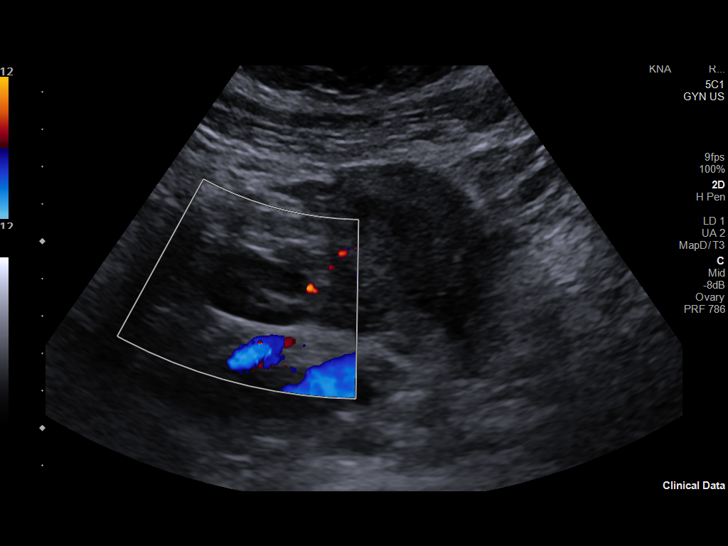
[im 40/98]
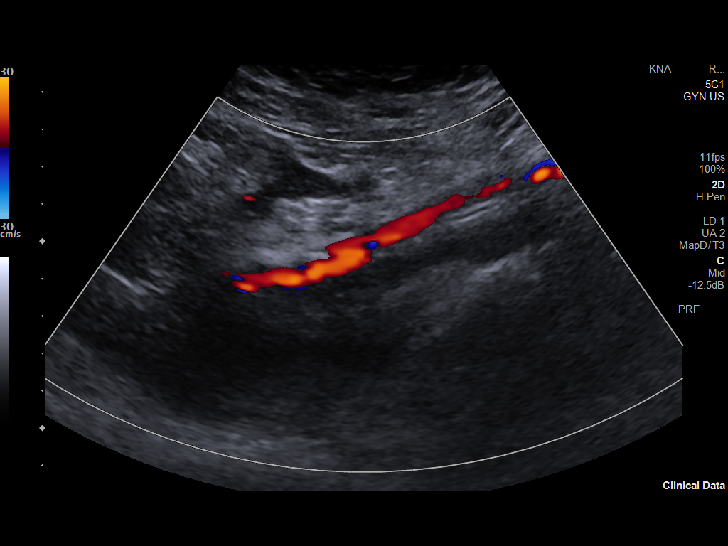
[im 51/98]
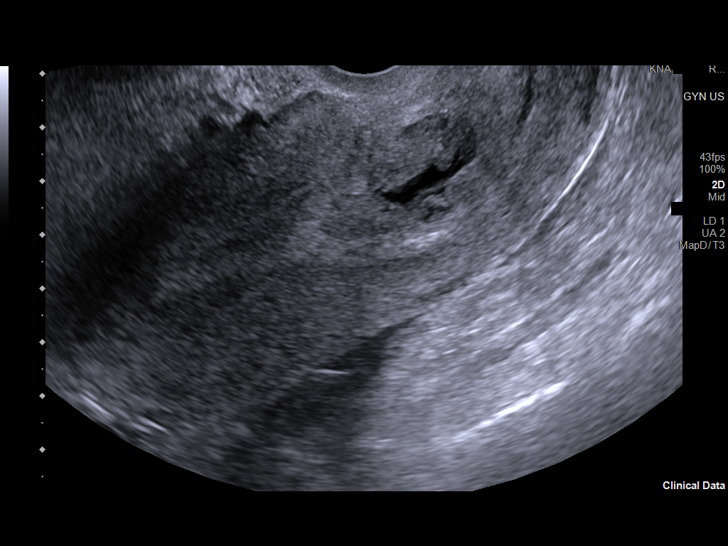
[im 58/98]
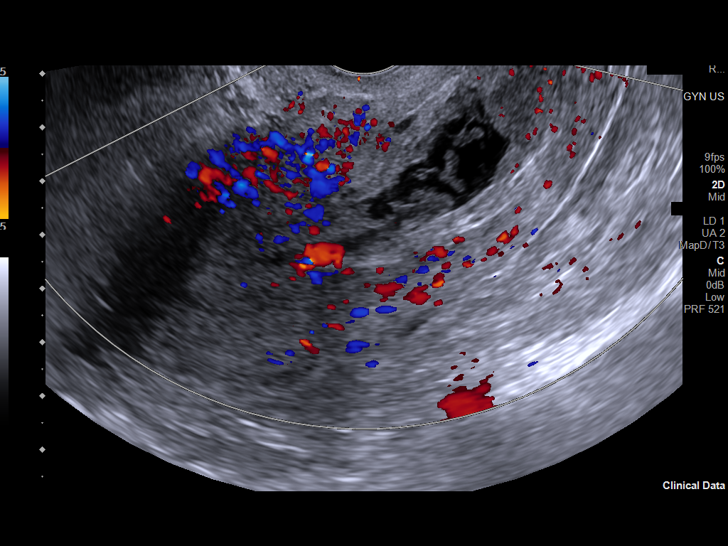
[im 65/98]
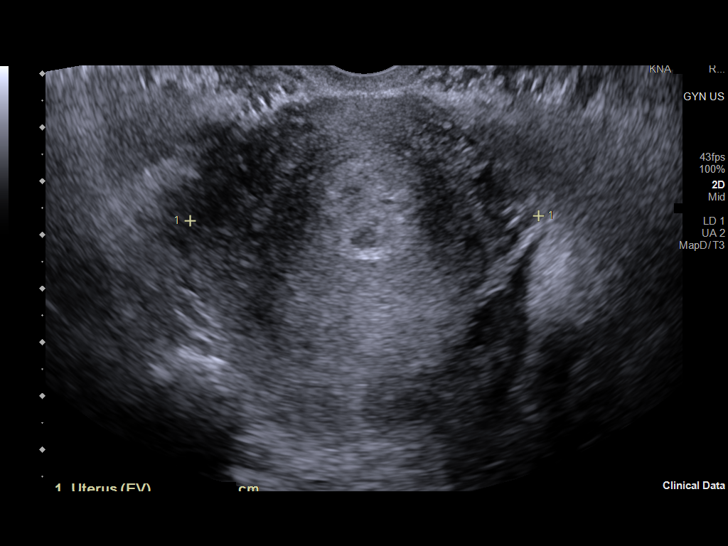
[im 72/98]
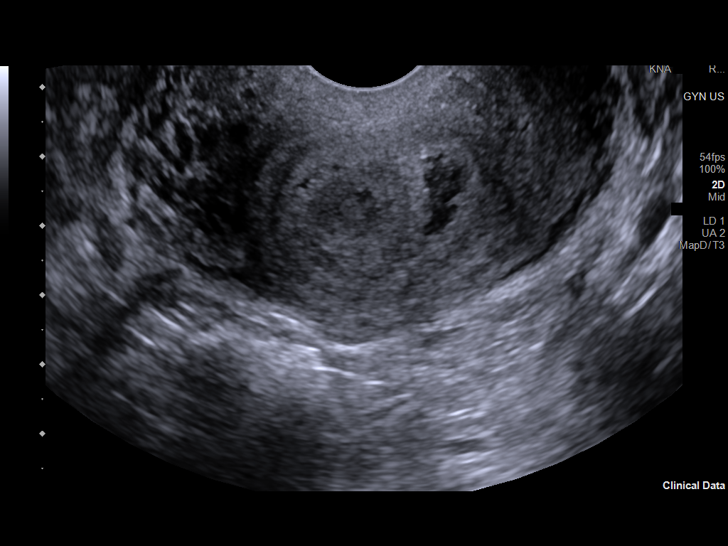
[im 80/98]
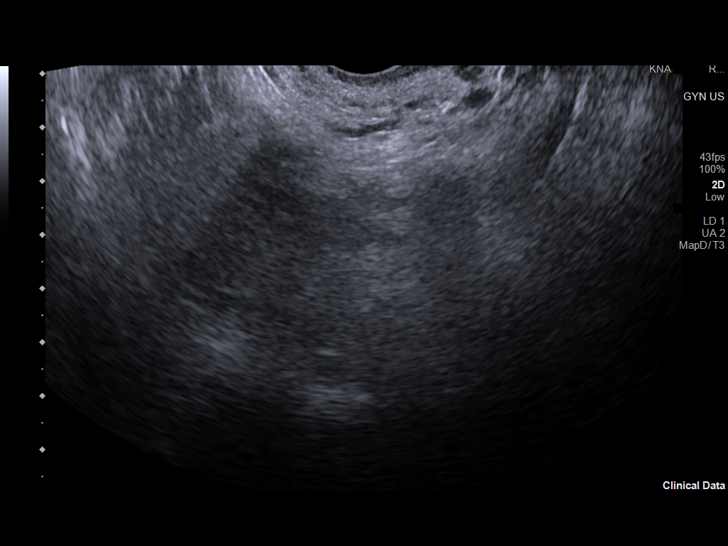
[im 87/98]
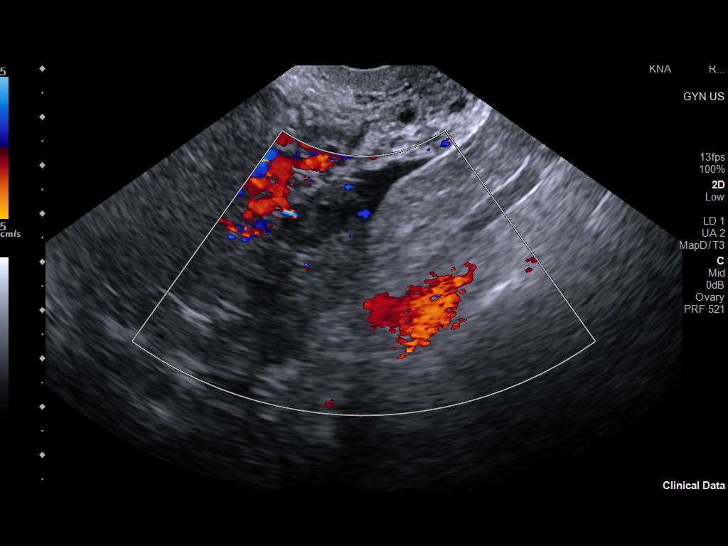
[im 94/98]
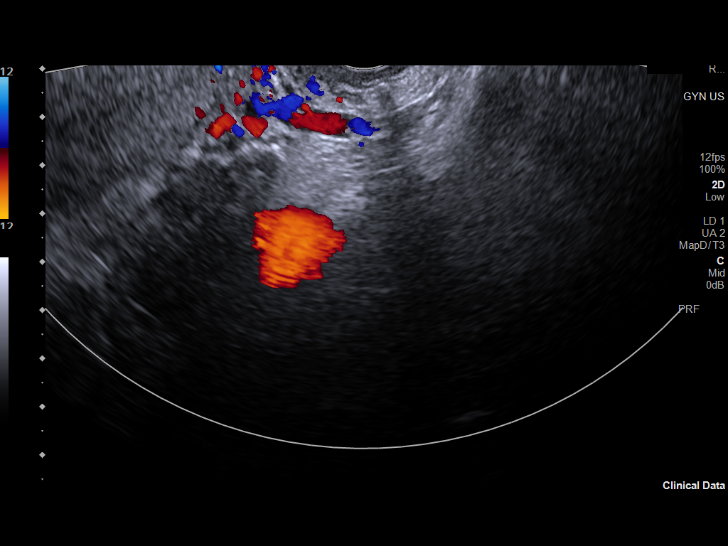

[13 of 28 positions shown; findings below may reference images not displayed]

FINDINGS: Intrauterine gestational sac: None

Yolk sac:  Not Visualized.

Embryo:  Not Visualized.

Cardiac Activity: Not Visualized.

Maternal uterus/adnexae: There is a irregular complex, heterogeneous
area with fluid and debris in the endometrial canal in the region of
the lower uterine segment. No definitive gestational sac or embryo.
Bilateral ovaries within normal limits. No adnexal masses
identified. Trace free fluid within the cul-de-sac.
IMPRESSION: Complex-appearing collection with fluid and debris in the
endometrial canal in the region of the lower uterine segment, which
are nonspecific but could reflect products of a failed first
trimester pregnancy. No definite intrauterine pregnancy. No findings
to suggest ectopic pregnancy. Recommend trending of beta HCG as well
as follow-up ultrasound in 7-10 days based on clinical course.

## 2023-06-18 ENCOUNTER — Ambulatory Visit: Payer: Medicaid Other | Admitting: Women's Health

## 2023-06-18 ENCOUNTER — Encounter: Payer: Self-pay | Admitting: Women's Health

## 2023-06-18 VITALS — BP 126/76 | HR 83 | Ht 62.0 in | Wt 180.0 lb

## 2023-06-18 DIAGNOSIS — L0292 Furuncle, unspecified: Secondary | ICD-10-CM | POA: Diagnosis not present

## 2023-06-18 NOTE — Progress Notes (Signed)
 GYN VISIT Patient name: Katherine [name] MRN [id number]  Date of birth: [birth year] Chief Complaint:   knot groin area (Rt side)  History of Present Illness:   Katherine [name] is a 33 y.o. G31P2012 Caucasian female being seen today for knot Rt groin x 2wks. Did squeeze it after it first appeared and got a little pus out and some blood. Tried to again later and nothing came out, it was just sore. Is still same size. Has been doing warm compresses. Denies abnormal discharge, itching/odor/irritation.      Patient's last menstrual period was 06/09/2023. The current method of family planning is OCP (estrogen/progesterone ).  Last pap 05/10/22. Results were: NILM w/ HRHPV negative     07/21/2022    8:30 AM 04/06/2022    3:17 PM 06/04/2020   11:16 AM 08/01/2019   10:03 AM 12/11/2016    2:38 PM  Depression screen PHQ 2/9  Decreased Interest 0 0 0 0 1  Down, Depressed, Hopeless 0 0 0 0 0  PHQ - 2 Score 0 0 0 0 1  Altered sleeping 2 1 0  0  Tired, decreased energy 1 1 1  2   Change in appetite 0 0 0  1  Feeling bad or failure about yourself  0 0 0  0  Trouble concentrating 0 0 0  0  Moving slowly or fidgety/restless 0 0   0  Suicidal thoughts 0 0   0  PHQ-9 Score 3 2 1  4   Difficult doing work/chores Somewhat difficult  Not difficult at all          07/21/2022    8:31 AM 04/06/2022    3:17 PM 06/04/2020   11:17 AM  GAD 7 : Generalized Anxiety Score  Nervous, Anxious, on Edge 0 1 1  Control/stop worrying 0 0 2  Worry too much - different things 1 0 2  Trouble relaxing 1 1 1   Restless 1 0 1  Easily annoyed or irritable 1 2 1   Afraid - awful might happen 0 0 0  Total GAD 7 Score 4 4 8   Anxiety Difficulty Not difficult at all  Not difficult at all     Review of Systems:   Pertinent items are noted in HPI Denies fever/chills, dizziness, headaches, visual disturbances, fatigue, shortness of breath, chest pain, abdominal pain, vomiting, abnormal vaginal discharge/itching/odor/irritation, problems  with periods, bowel movements, urination, or intercourse unless otherwise stated above.  Pertinent History Reviewed:  Reviewed past medical,surgical, social, obstetrical and family history.  Reviewed problem list, medications and allergies. Physical Assessment:   Vitals:   06/18/23 0915  BP: 126/76  Pulse: 83  Weight: 180 lb (81.6 kg)  Height: 5\' 2"  (1.575 m)  Body mass index is 32.92 kg/m.       Physical Examination:   General appearance: alert, well appearing, and in no distress  Mental status: alert, oriented to person, place, and time  Skin: warm & dry   Cardiovascular: normal heart rate noted  Respiratory: normal respiratory effort, no distress  Abdomen: soft, non-tender   Pelvic: Rt inner thigh w/ nonvisible ~4cm long x ~1cm wide firm mobile nontender area, co-exam w/ Dr. Ozan, thinks it is a boil (not lymph node)   Extremities: no edema   Chaperone: Peggy Dones    No results found for this or any previous visit (from the past 24 hours).  Assessment & Plan:  1) Boil Rt inner thigh> per Dr. Ozan no antibiotics needed, no evidence  of infection. Warm compresses 2-3x/day, epsom salt baths, let us  know if getting bigger/signs of infection or just not going away  Meds: No orders of the defined types were placed in this encounter.   No orders of the defined types were placed in this encounter.   Return in about 1 year (around 06/17/2024) for Physical.  Ferd Householder CNM, [facility] 06/18/2023 9:46 AM

## 2023-06-18 NOTE — Patient Instructions (Signed)
 Warm compresses 2-3x/day Epsom salt baths Let us  know if getting bigger, looks infected or just not going away

## 2023-07-20 ENCOUNTER — Other Ambulatory Visit: Payer: Self-pay | Admitting: Otolaryngology

## 2023-07-26 ENCOUNTER — Other Ambulatory Visit: Payer: Self-pay | Admitting: Obstetrics & Gynecology

## 2023-09-03 ENCOUNTER — Other Ambulatory Visit (HOSPITAL_COMMUNITY)

## 2023-09-03 NOTE — Progress Notes (Signed)
 Surgical Instructions   Your procedure is scheduled on Friday May 2. Report to [facility] Main Entrance "A" at 8:00 A.M., then check in with the Admitting office. Any questions or running late day of surgery: call [phone]  Questions prior to your surgery date: call [phone], Monday-Friday, 8am-4pm. If you experience any cold or flu symptoms such as cough, fever, chills, shortness of breath, etc. between now and your scheduled surgery, please notify us  at the above number.     Remember:  Do not eat after midnight the night before your surgery   You may drink clear liquids until 7:00am the morning of your surgery.   Clear liquids allowed are: Water, Non-Citrus Juices (without pulp), Carbonated Beverages, Clear Tea (no milk, honey, etc.), Black Coffee Only (NO MILK, CREAM OR POWDERED CREAMER of any kind), and Gatorade.    Take these medicines the morning of surgery with A SIP OF WATER  desogestrel-ethinyl estradiol (ISIBLOOM)  omeprazole  (PRILOSEC)   May take these medicines IF NEEDED: acetaminophen  (TYLENOL )         HOLD WEGOVY 7 DAYS PRIOR TO SURGERY. DO NOT TAKE WEGOVY AFTER 08/30/23.  One week prior to surgery, STOP taking any Aspirin (unless otherwise instructed by your surgeon) Aleve, Naproxen, Ibuprofen , Motrin , Advil , Goody's, BC's, all herbal medications, fish oil, and non-prescription vitamins.                     Do NOT Smoke (Tobacco/Vaping) for 24 hours prior to your procedure.  If you use a CPAP at night, you may bring your mask/headgear for your overnight stay.   You will be asked to remove any contacts, glasses, piercing's, hearing aid's, dentures/partials prior to surgery. Please bring cases for these items if needed.    Patients discharged the day of surgery will not be allowed to drive home, and someone needs to stay with them for 24 hours.  SURGICAL WAITING ROOM VISITATION Patients may have no more than 2 support people in the waiting area - these  visitors may rotate.   Pre-op nurse will coordinate an appropriate time for 1 ADULT support person, who may not rotate, to accompany patient in pre-op.  Children under the age of 55 must have an adult with them who is not the patient and must remain in the main waiting area with an adult.  If the patient needs to stay at the hospital during part of their recovery, the visitor guidelines for inpatient rooms apply.  Please refer to the [facility] website for the visitor guidelines for any additional information.   If you received a COVID test during your pre-op visit  it is requested that you wear a mask when out in public, stay away from anyone that may not be feeling well and notify your surgeon if you develop symptoms. If you have been in contact with anyone that has tested positive in the last 10 days please notify you surgeon.      Pre-operative CHG Bathing Instructions   You can play a key role in reducing the risk of infection after surgery. Your skin needs to be as free of germs as possible. You can reduce the number of germs on your skin by washing with CHG (chlorhexidine gluconate) soap before surgery. CHG is an antiseptic soap that kills germs and continues to kill germs even after washing.   DO NOT use if you have an allergy to chlorhexidine/CHG or antibacterial soaps. If your skin becomes reddened or irritated, stop using  the CHG and notify one of our RNs at [phone].              TAKE A SHOWER THE NIGHT BEFORE SURGERY AND THE DAY OF SURGERY    Please keep in mind the following:  DO NOT shave, including legs and underarms, 48 hours prior to surgery.   You may shave your face before/day of surgery.  Place clean sheets on your bed the night before surgery Use a clean washcloth (not used since being washed) for each shower. DO NOT sleep with pet's night before surgery.  CHG Shower Instructions:  Wash your face and private area with normal soap. If you choose to wash your  hair, wash first with your normal shampoo.  After you use shampoo/soap, rinse your hair and body thoroughly to remove shampoo/soap residue.  Turn the water OFF and apply half the bottle of CHG soap to a CLEAN washcloth.  Apply CHG soap ONLY FROM YOUR NECK DOWN TO YOUR TOES (washing for 3-5 minutes)  DO NOT use CHG soap on face, private areas, open wounds, or sores.  Pay special attention to the area where your surgery is being performed.  If you are having back surgery, having someone wash your back for you may be helpful. Wait 2 minutes after CHG soap is applied, then you may rinse off the CHG soap.  Pat dry with a clean towel  Put on clean pajamas    Additional instructions for the day of surgery: DO NOT APPLY any lotions, deodorants, cologne, or perfumes.   Do not wear jewelry or makeup Do not wear nail polish, gel polish, artificial nails, or any other type of covering on natural nails (fingers and toes) Do not bring valuables to the hospital. [facility] is not responsible for valuables/personal belongings. Put on clean/comfortable clothes.  Please brush your teeth.  Ask your nurse before applying any prescription medications to the skin.

## 2023-09-04 ENCOUNTER — Encounter (HOSPITAL_COMMUNITY): Payer: Self-pay

## 2023-09-04 ENCOUNTER — Other Ambulatory Visit: Payer: Self-pay

## 2023-09-04 ENCOUNTER — Encounter (HOSPITAL_COMMUNITY)
Admission: RE | Admit: 2023-09-04 | Discharge: 2023-09-04 | Disposition: A | Discharge status: Home / Self Care | Source: Ambulatory Visit | Attending: Otolaryngology | Admitting: Otolaryngology

## 2023-09-04 VITALS — BP 111/75 | HR 93 | Temp 98.4°F | Resp 16 | Ht 62.5 in | Wt 175.0 lb

## 2023-09-04 DIAGNOSIS — Z01812 Encounter for preprocedural laboratory examination: Secondary | ICD-10-CM | POA: Insufficient documentation

## 2023-09-04 DIAGNOSIS — Z01818 Encounter for other preprocedural examination: Secondary | ICD-10-CM

## 2023-09-04 LAB — CBC
HCT: 39.5 % (ref 36.0–46.0)
Hemoglobin: 12.9 g/dL (ref 12.0–15.0)
MCH: 27.9 pg (ref 26.0–34.0)
MCHC: 32.7 g/dL (ref 30.0–36.0)
MCV: 85.5 fL (ref 80.0–100.0)
Platelets: 250 10*3/uL (ref 150–400)
RBC: 4.62 MIL/uL (ref 3.87–5.11)
RDW: 13.6 % (ref 11.5–15.5)
WBC: 7.4 10*3/uL (ref 4.0–10.5)
nRBC: 0 % (ref 0.0–0.2)

## 2023-09-04 NOTE — Progress Notes (Signed)
 PCP - [facility] Cardiologist - denies  PPM/ICD - denies Device Orders -  Rep Notified -   Chest x-ray - na EKG - na Stress Test - denies ECHO - denies Cardiac Cath - denies  Sleep Study - denies  CPAP -   Fasting Blood Sugar - na Checks Blood Sugar _____ times a day  Last dose of GLP1 agonist-  08/30/23 GLP1 instructions: do not take Wegovy after 08/30/23  Blood Thinner Instructions:na Aspirin Instructions:na  ERAS Protcol -clears until 0700 PRE-SURGERY Ensure or G2- no  COVID TEST- na   Anesthesia review: no  Patient denies shortness of breath, fever, cough and chest pain at PAT appointment   All instructions explained to the patient, with a verbal understanding of the material. Patient agrees to go over the instructions while at home for a better understanding.  The opportunity to ask questions was provided.

## 2023-09-07 ENCOUNTER — Ambulatory Visit (HOSPITAL_BASED_OUTPATIENT_CLINIC_OR_DEPARTMENT_OTHER)

## 2023-09-07 ENCOUNTER — Other Ambulatory Visit (HOSPITAL_COMMUNITY): Payer: Self-pay

## 2023-09-07 ENCOUNTER — Other Ambulatory Visit: Payer: Self-pay

## 2023-09-07 ENCOUNTER — Encounter (HOSPITAL_COMMUNITY): Payer: Self-pay | Admitting: Otolaryngology

## 2023-09-07 ENCOUNTER — Encounter (HOSPITAL_COMMUNITY)
Admission: RE | Disposition: A | Discharge status: Home / Self Care | Payer: Self-pay | Source: Home / Self Care | Attending: Otolaryngology

## 2023-09-07 ENCOUNTER — Ambulatory Visit (HOSPITAL_COMMUNITY)
Admission: RE | Admit: 2023-09-07 | Discharge: 2023-09-07 | Disposition: A | Discharge status: Home / Self Care | Attending: Otolaryngology | Admitting: Otolaryngology

## 2023-09-07 ENCOUNTER — Ambulatory Visit (HOSPITAL_COMMUNITY)

## 2023-09-07 DIAGNOSIS — E119 Type 2 diabetes mellitus without complications: Secondary | ICD-10-CM | POA: Diagnosis not present

## 2023-09-07 DIAGNOSIS — K219 Gastro-esophageal reflux disease without esophagitis: Secondary | ICD-10-CM | POA: Diagnosis not present

## 2023-09-07 DIAGNOSIS — Z7985 Long-term (current) use of injectable non-insulin antidiabetic drugs: Secondary | ICD-10-CM | POA: Insufficient documentation

## 2023-09-07 DIAGNOSIS — J343 Hypertrophy of nasal turbinates: Secondary | ICD-10-CM | POA: Diagnosis not present

## 2023-09-07 DIAGNOSIS — Z87891 Personal history of nicotine dependence: Secondary | ICD-10-CM | POA: Diagnosis not present

## 2023-09-07 DIAGNOSIS — J342 Deviated nasal septum: Secondary | ICD-10-CM | POA: Diagnosis present

## 2023-09-07 DIAGNOSIS — Z01818 Encounter for other preprocedural examination: Secondary | ICD-10-CM

## 2023-09-07 HISTORY — PX: SEPTOPLASTY: SHX2393

## 2023-09-07 HISTORY — PX: TURBINATE REDUCTION: SHX6157

## 2023-09-07 LAB — POCT PREGNANCY, URINE: Preg Test, Ur: NEGATIVE

## 2023-09-07 SURGERY — SEPTOPLASTY, NOSE
Anesthesia: General | Laterality: Bilateral

## 2023-09-07 MED ORDER — BACITRACIN 500 UNIT/GM EX OINT
TOPICAL_OINTMENT | CUTANEOUS | Status: DC | PRN
  Administered 2023-09-07: 1 via TOPICAL

## 2023-09-07 MED ORDER — MUPIROCIN 2 % EX OINT
TOPICAL_OINTMENT | CUTANEOUS | Status: AC
Start: 2023-09-07 — End: ?
  Filled 2023-09-07: qty 22

## 2023-09-07 MED ORDER — SUGAMMADEX SODIUM 200 MG/2ML IV SOLN
INTRAVENOUS | Status: DC | PRN
  Administered 2023-09-07: 200 mg via INTRAVENOUS

## 2023-09-07 MED ORDER — ORAL CARE MOUTH RINSE: 15.0000 mL | Freq: Once | OROMUCOSAL | Status: AC

## 2023-09-07 MED ORDER — BACITRACIN ZINC 500 UNIT/GM EX OINT
TOPICAL_OINTMENT | CUTANEOUS | Status: AC
Start: 2023-09-07 — End: ?
  Filled 2023-09-07: qty 28.35

## 2023-09-07 MED ORDER — MIDAZOLAM HCL 2 MG/2ML IJ SOLN: 0.5000 mg | Freq: Once | INTRAMUSCULAR | Status: DC | PRN

## 2023-09-07 MED ORDER — PHENYLEPHRINE 80 MCG/ML (10ML) SYRINGE FOR IV PUSH (FOR BLOOD PRESSURE SUPPORT)
PREFILLED_SYRINGE | INTRAVENOUS | Status: DC | PRN
  Administered 2023-09-07: 80 ug via INTRAVENOUS
  Administered 2023-09-07: 120 ug via INTRAVENOUS

## 2023-09-07 MED ORDER — PROPOFOL 10 MG/ML IV BOLUS
INTRAVENOUS | Status: DC | PRN
  Administered 2023-09-07: 200 mg via INTRAVENOUS

## 2023-09-07 MED ORDER — EPINEPHRINE HCL (NASAL) 0.1 % NA SOLN
NASAL | Status: AC
  Filled 2023-09-07: qty 30

## 2023-09-07 MED ORDER — MIDAZOLAM HCL 2 MG/2ML IJ SOLN
INTRAMUSCULAR | Status: DC | PRN
  Administered 2023-09-07: 2 mg via INTRAVENOUS

## 2023-09-07 MED ORDER — ACETAMINOPHEN 500 MG PO TABS
ORAL_TABLET | ORAL | Status: AC
  Administered 2023-09-07: 1000 mg via ORAL
  Filled 2023-09-07: qty 2

## 2023-09-07 MED ORDER — MEPERIDINE HCL 25 MG/ML IJ SOLN: 6.2500 mg | INTRAMUSCULAR | Status: DC | PRN

## 2023-09-07 MED ORDER — PROPOFOL 10 MG/ML IV BOLUS
INTRAVENOUS | Status: AC
  Filled 2023-09-07: qty 20

## 2023-09-07 MED ORDER — LIDOCAINE 2% (20 MG/ML) 5 ML SYRINGE
INTRAMUSCULAR | Status: AC
  Filled 2023-09-07: qty 5

## 2023-09-07 MED ORDER — OXYCODONE HCL 5 MG/5ML PO SOLN: 5.0000 mg | Freq: Once | ORAL | Status: AC | PRN

## 2023-09-07 MED ORDER — LIDOCAINE 2% (20 MG/ML) 5 ML SYRINGE
INTRAMUSCULAR | Status: DC | PRN
  Administered 2023-09-07: 20 mg via INTRAVENOUS

## 2023-09-07 MED ORDER — ONDANSETRON HCL 4 MG/2ML IJ SOLN
INTRAMUSCULAR | Status: DC | PRN
  Administered 2023-09-07: 4 mg via INTRAVENOUS

## 2023-09-07 MED ORDER — ROCURONIUM BROMIDE 10 MG/ML (PF) SYRINGE
PREFILLED_SYRINGE | INTRAVENOUS | Status: AC
Start: 2023-09-07 — End: ?
  Filled 2023-09-07: qty 10

## 2023-09-07 MED ORDER — LIDOCAINE-EPINEPHRINE 1 %-1:100000 IJ SOLN
INTRAMUSCULAR | Status: DC | PRN
  Administered 2023-09-07: 10 mL

## 2023-09-07 MED ORDER — FENTANYL CITRATE (PF) 250 MCG/5ML IJ SOLN
INTRAMUSCULAR | Status: DC | PRN
  Administered 2023-09-07: 100 ug via INTRAVENOUS

## 2023-09-07 MED ORDER — ACETAMINOPHEN 500 MG PO TABS: 1000.0000 mg | ORAL_TABLET | Freq: Once | ORAL | Status: AC

## 2023-09-07 MED ORDER — OXYCODONE HCL 5 MG PO TABS
5.0000 mg | ORAL_TABLET | Freq: Once | ORAL | Status: AC | PRN
  Administered 2023-09-07: 5 mg via ORAL

## 2023-09-07 MED ORDER — CHLORHEXIDINE GLUCONATE 0.12 % MT SOLN
15.0000 mL | Freq: Once | OROMUCOSAL | Status: AC
  Administered 2023-09-07: 15 mL via OROMUCOSAL
  Filled 2023-09-07: qty 15

## 2023-09-07 MED ORDER — DEXAMETHASONE SODIUM PHOSPHATE 10 MG/ML IJ SOLN
INTRAMUSCULAR | Status: DC | PRN
Start: 2023-09-07 — End: 2023-09-07
  Administered 2023-09-07: 10 mg via INTRAVENOUS

## 2023-09-07 MED ORDER — DEXMEDETOMIDINE HCL IN NACL 80 MCG/20ML IV SOLN
INTRAVENOUS | Status: DC | PRN
  Administered 2023-09-07 (×2): 8 ug via INTRAVENOUS

## 2023-09-07 MED ORDER — OXYCODONE HCL 5 MG PO TABS
ORAL_TABLET | ORAL | Status: AC
  Filled 2023-09-07: qty 1

## 2023-09-07 MED ORDER — ONDANSETRON HCL 4 MG/2ML IJ SOLN
INTRAMUSCULAR | Status: AC
  Filled 2023-09-07: qty 2

## 2023-09-07 MED ORDER — HYDROMORPHONE HCL 1 MG/ML IJ SOLN: 0.2500 mg | INTRAMUSCULAR | Status: DC | PRN

## 2023-09-07 MED ORDER — MIDAZOLAM HCL 2 MG/2ML IJ SOLN
INTRAMUSCULAR | Status: AC
  Filled 2023-09-07: qty 2

## 2023-09-07 MED ORDER — EPINEPHRINE HCL (NASAL) 0.1 % NA SOLN
NASAL | Status: DC | PRN
  Administered 2023-09-07: 1 [drp] via NASAL

## 2023-09-07 MED ORDER — FENTANYL CITRATE (PF) 250 MCG/5ML IJ SOLN
INTRAMUSCULAR | Status: AC
Start: 2023-09-07 — End: ?
  Filled 2023-09-07: qty 5

## 2023-09-07 MED ORDER — HYDROCODONE-ACETAMINOPHEN 5-325 MG PO TABS
1.0000 | ORAL_TABLET | ORAL | 0 refills | Status: AC | PRN
  Filled 2023-09-07: qty 30, 5d supply, fill #0

## 2023-09-07 MED ORDER — ROCURONIUM BROMIDE 10 MG/ML (PF) SYRINGE
PREFILLED_SYRINGE | INTRAVENOUS | Status: DC | PRN
  Administered 2023-09-07: 60 mg via INTRAVENOUS

## 2023-09-07 MED ORDER — LACTATED RINGERS IV SOLN: INTRAVENOUS | Status: DC

## 2023-09-07 MED ORDER — DEXAMETHASONE SODIUM PHOSPHATE 10 MG/ML IJ SOLN
INTRAMUSCULAR | Status: AC
Start: 2023-09-07 — End: ?
  Filled 2023-09-07: qty 1

## 2023-09-07 SURGICAL SUPPLY — 28 items
BAG COUNTER SPONGE SURGICOUNT (BAG) ×1 IMPLANT
BLADE INF TURB ROT M4 2 5PK (BLADE) ×1 IMPLANT
CANISTER SUCT 3000ML PPV (MISCELLANEOUS) ×1 IMPLANT
COAGULATOR SUCT 8FR VV (MISCELLANEOUS) IMPLANT
COAGULATOR SUCT SWTCH 10FR 6 (ELECTROSURGICAL) IMPLANT
DRAPE HALF SHEET 40X57 (DRAPES) IMPLANT
ELECTRODE REM PT RTRN 9FT ADLT (ELECTROSURGICAL) IMPLANT
GAUZE SPONGE 2X2 8PLY STRL LF (GAUZE/BANDAGES/DRESSINGS) ×1 IMPLANT
GLOVE BIO SURGEON STRL SZ 6.5 (GLOVE) ×2 IMPLANT
GOWN STRL REUS W/ TWL LRG LVL3 (GOWN DISPOSABLE) ×2 IMPLANT
KIT BASIN OR (CUSTOM PROCEDURE TRAY) ×1 IMPLANT
KIT TURNOVER KIT B (KITS) ×1 IMPLANT
NDL HYPO 25GX1X1/2 BEV (NEEDLE) ×1 IMPLANT
NEEDLE HYPO 25GX1X1/2 BEV (NEEDLE) ×1 IMPLANT
NS IRRIG 1000ML POUR BTL (IV SOLUTION) ×1 IMPLANT
PAD ARMBOARD POSITIONER FOAM (MISCELLANEOUS) ×1 IMPLANT
PATTIES SURGICAL .5 X3 (DISPOSABLE) ×1 IMPLANT
SOLUTION ANTFG W/FOAM PAD STRL (MISCELLANEOUS) IMPLANT
SPLINT NASAL DOYLE BI-VL (GAUZE/BANDAGES/DRESSINGS) ×1 IMPLANT
SUT CHROMIC 4 0 P 3 18 (SUTURE) ×1 IMPLANT
SUT ETHILON 3 0 PS 1 (SUTURE) IMPLANT
SUT PLAIN 4 0 ~~LOC~~ 1 (SUTURE) ×1 IMPLANT
SUT SILK 2 0 SH (SUTURE) ×1 IMPLANT
SYR TB 1ML LUER SLIP (SYRINGE) ×1 IMPLANT
TOWEL GREEN STERILE FF (TOWEL DISPOSABLE) ×1 IMPLANT
TRAY ENT MC OR (CUSTOM PROCEDURE TRAY) ×1 IMPLANT
TUBE SALEM SUMP 16F (TUBING) ×1 IMPLANT
TUBING EXTENTION W/L.L. (IV SETS) ×1 IMPLANT

## 2023-09-07 NOTE — H&P (Signed)
 Katherine [name] is an 33 y.o. female.    Chief Complaint:  Deviated nasal septum  HPI: Patient presents today for planned elective procedure.  She denies any interval change in history since office visit on 12/25/2022:  Katherine [name] is Katherine 33 y.o. female who presents as an established patient to discuss surgical options to improve nasal breathing. Patient last seen in September 2023 for ear fullness and deviated septum. She is Katherine previous patient with Dr. Odean [name]; seen for chronic rhinitis and deviated nasal septum. Nasal exam demonstrated Katherine mild leftward septal deviation. She has been using Flonase consistently now for years without improvement in breathing. Others comment that she is Katherine "noisy" breather and she finds herself frequently breathing through her mouth. Wakes up with Katherine dry mouth from snoring; no witnessed apnea. She feels her left nasal passage may be more blocked than the right. No use of topical decongestant nasal sprays.   No history of recurrent sinusitis or ear infections. No injury to the nose or prior sinonasal surgery.  Denies otalgia, otorrhea, ear pruritus, tinnitus, dizziness, purulent rhinorrhea, cough or hoarseness.   No PMH of asthma, diabetes, bleeding disorder or adverse reaction to anesthesia.   Past Medical History:  Diagnosis Date   Bacterial vaginosis    Gestational diabetes    Secondary oligomenorrhea     Past Surgical History:  Procedure Laterality Date   TONSILLECTOMY      Family History  Problem Relation Age of Onset   Alcohol abuse Paternal Grandfather    Heart attack Maternal Grandmother    Diabetes Mother    Cancer Mother    Asthma Brother    ADD / ADHD Brother    Drug abuse Brother    Seizures Maternal Uncle    Heart disease Paternal Aunt    Mental retardation Cousin     Social History:  reports that she has quit smoking. Her smoking use included cigars and e-cigarettes. She has never used smokeless tobacco. She reports that she does not  currently use alcohol. She reports that she does not currently use drugs after having used the following drugs: Marijuana.  Allergies:  Allergies  Allergen Reactions   Sulfa  Antibiotics Swelling    Medications Prior to Admission  Medication Sig Dispense Refill   acetaminophen  (TYLENOL ) 325 MG tablet Take 2 tablets (650 mg total) by mouth every 4 (four) hours as needed (for pain scale < 4). 60 tablet 0   Calcium  Carbonate Antacid (TUMS PO) Take 1 tablet by mouth daily as needed (heartburn).     desogestrel-ethinyl estradiol (ISIBLOOM) 0.15-30 MG-MCG tablet Take 1 tablet by mouth daily.     levocetirizine (XYZAL) 5 MG tablet Take 5 mg by mouth every evening.     omeprazole  (PRILOSEC) 20 MG capsule TAKE 1 CAPSULE BY MOUTH DAILY 30 capsule 6   ibuprofen  (ADVIL ) 600 MG tablet Take 1 tablet (600 mg total) by mouth every 6 (six) hours. (Patient not taking: Reported on 08/30/2023) 30 tablet 0   WEGOVY 1.7 MG/0.75ML SOAJ Inject 1.7 mg into the skin once Katherine week.      No results found for this or any previous visit (from the past 48 hours). No results found.  ROS: ROS  Blood pressure 103/72, pulse 73, temperature 97.7 F (36.5 C), temperature source Oral, resp. rate 18, height 5\' 3"  (1.6 m), weight 79.4 kg, last menstrual period 09/01/2023, SpO2 97%, not currently breastfeeding.  PHYSICAL EXAM: Physical Exam Constitutional:      Appearance: Normal appearance.  Pulmonary:     Effort: Pulmonary effort is normal.  Neurological:     General: No focal deficit present.     Mental Status: She is alert. Mental status is at baseline.     Studies Reviewed: None   Assessment/Plan Katherine [name] is Katherine 33 y.o. female with history and exam consistent with leftward nasal septum deviation and inferior turbinate hypertrophy causing functional nasal obstruction. Use of topical nasal steroids spray and allergy medications have failed.  -To OR for septoplasty and bilateral inferior turbinate reduction. Risks,  benefits and recovery were completely reviewed, all questions answered.     Katherine [name] Katherine [name] 09/07/2023, 8:36 AM

## 2023-09-07 NOTE — Op Note (Signed)
 OPERATIVE NOTE  Katherine [name] Date/Time of Admission: 09/07/2023  7:54 AM  CSN: [id number];NWG:[id number] Attending Provider: Drucilla Georgis A, DO Room/Bed: MCPO/NONE DOB: [birth year] Age: 33 y.o.   Pre-Op Diagnosis: Deviated nasal septum Nasal turbinate hypertrophy  Post-Op Diagnosis: Deviated nasal septum Nasal turbinate hypertrophy  Procedure: Procedure(s): SEPTOPLASTY BILATERAL INFERIOR TURBINATE REDUCTION3  Anesthesia: General  Surgeon(s): Angelyn Osterberg A Blannie Shedlock, DO  Staff: Circulator: Everhart, Carola Chu, RN Scrub Person: Sabra Cramp, Amy E  Implants: * No implants in log *  Specimens: * No specimens in log *  Complications: None  EBL: 20 ML  Condition: stable  Operative Findings:  Left septal deviation with very large spur, bilateral inferior turbinate hypertrophy  Description of Operation: Once operative consent was obtained and the site and surgery were confirmed with the patient and the operating room team, the patient was brought back to the operating room and general endotracheal anesthesia was obtained. Lidocaine  1% with 1:100,000 epinephrine  was injected into the nasal septum bilaterally and inferior turbinates bilaterally Afrin-soaked pledgets were placed into the nasal cavity, and the patient was prepped and draped in sterile fashion. Attention was first turned to the left nasal vestibule. A left sided hemi-transfixion incision was made a submucoperichondrial flap was elevated on the left.  A submucous resection of  nasal septal cartilage was performed with care taken to leave a 1 cm caudal and dorsal strut. In doing this, a right-sided submucoperichondrial flap was elevated.  The bony nasal septum was addressed by lifting up the soft tissues, separating the superior septum with a double action scissor and then removing the inferior deflected portion. The nasal spine was removed with a 4mm osteotome. With this completed, the nasal septum was midline. The  submucoperichondrial flaps were returned to their anatomic position and hemi-transfixion incision was closed with interrupted 4-0 chromic gut. A 4-0 plain gut suture was used to perform a mattress style stitch in a circular direction from anterior to posterior septum to re-approximate the right and left sided flaps.  Attention was then turned to the inferior turbinates.They were outfractured and then submucous resection was performed by making an incision in the leading edge with a 15 blade, separating the mucosa from bone with a Cottle elevator and then using the micro debrider with a turbinate blade to remove bone. Bacitracin  covered [name] nasal splints were placed in the bilateral nasal cavities and sutured to the columella with a 2-0 Silk suture and a nasal drip pad was applied. An orogastric tube was placed and the stomach cavity was suctioned to reduce postoperative nausea. The patient was turned over to anesthesia service and was extubated in the operating room and transferred to the PACU in stable condition. The patient will be discharged today and followed up in the ENT clinic in 1 week for  splint removal and a postoperative check.    [name], DO [facility]  09/07/2023

## 2023-09-07 NOTE — Anesthesia Procedure Notes (Signed)
 Procedure Name: Intubation Date/Time: 09/07/2023 10:52 AM  Performed by: Artemisa Bile D, CRNAPre-anesthesia Checklist: Patient identified, Emergency Drugs available, Suction available and Patient being monitored Patient Re-evaluated:Patient Re-evaluated prior to induction Oxygen Delivery Method: Circle System Utilized Preoxygenation: Pre-oxygenation with 100% oxygen Induction Type: IV induction Ventilation: Mask ventilation without difficulty Laryngoscope Size: Miller and 3 Grade View: Grade I Tube type: Oral Tube size: 7.0 mm Number of attempts: 1 Airway Equipment and Method: Stylet and Oral airway Placement Confirmation: ETT inserted through vocal cords under direct vision, positive ETCO2 and breath sounds checked- equal and bilateral Secured at: 22 cm Tube secured with: Tape Dental Injury: Teeth and Oropharynx as per pre-operative assessment

## 2023-09-07 NOTE — Anesthesia Postprocedure Evaluation (Signed)
 Anesthesia Post Note  Patient: Katherine [name]  Procedure(s) Performed: SEPTOPLASTY, NOSE (Bilateral) REDUCTION, NASAL TURBINATE (Bilateral)     Patient location during evaluation: PACU Anesthesia Type: General Level of consciousness: awake and alert, patient cooperative and oriented Pain management: pain level controlled Vital Signs Assessment: post-procedure vital signs reviewed and stable Respiratory status: spontaneous breathing, nonlabored ventilation and respiratory function stable Cardiovascular status: blood pressure returned to baseline and stable Postop Assessment: no apparent nausea or vomiting Anesthetic complications: no   No notable events documented.  Last Vitals:  Vitals:   09/07/23 1230 09/07/23 1245  BP: (!) 104/52 (!) 125/91  Pulse: 80 75  Resp: 12 20  Temp:  36.9 C  SpO2: 96% 100%    Last Pain:  Vitals:   09/07/23 1230  TempSrc:   PainSc: 0-No pain                 Latoyna Hird,E. Theoplis Garciagarcia

## 2023-09-07 NOTE — Transfer of Care (Signed)
 Immediate Anesthesia Transfer of Care Note  Patient: Katherine [name]  Procedure(s) Performed: SEPTOPLASTY, NOSE (Bilateral) REDUCTION, NASAL TURBINATE (Bilateral)  Patient Location: PACU  Anesthesia Type:General  Level of Consciousness: awake, alert , and oriented  Airway & Oxygen Therapy: Patient Spontanous Breathing  Post-op Assessment: Report given to RN and Post -op Vital signs reviewed and stable  Post vital signs: Reviewed and stable  Last Vitals:  Vitals Value Taken Time  BP 112/69 09/07/23 1206  Temp 36.8 C 09/07/23 1205  Pulse 90 09/07/23 1213  Resp 12 09/07/23 1213  SpO2 94 % 09/07/23 1213  Vitals shown include unfiled device data.  Last Pain:  Vitals:   09/07/23 1205  TempSrc:   PainSc: Asleep         Complications: No notable events documented.

## 2023-09-07 NOTE — Anesthesia Preprocedure Evaluation (Addendum)
 Anesthesia Evaluation  Patient identified by MRN, date of birth, ID band Patient awake    Reviewed: Allergy & Precautions, NPO status , Patient's Chart, lab work & pertinent test results  History of Anesthesia Complications Negative for: history of anesthetic complications  Airway Mallampati: II  TM Distance: >3 FB Neck ROM: Full    Dental  (+) Dental Advisory Given, Teeth Intact   Pulmonary Current Smoker and Patient abstained from smoking.   breath sounds clear to auscultation       Cardiovascular negative cardio ROS  Rhythm:Regular Rate:Normal     Neuro/Psych negative neurological ROS     GI/Hepatic Neg liver ROS,GERD  Medicated and Controlled,,  Endo/Other  diabetes  BMI 31 Wegovy: last 8d ago  Renal/GU negative Renal ROS     Musculoskeletal   Abdominal   Peds  Hematology negative hematology ROS (+)   Anesthesia Other Findings   Reproductive/Obstetrics                             Anesthesia Physical Anesthesia Plan  ASA: 2  Anesthesia Plan: General   Post-op Pain Management: Tylenol  PO (pre-op)*   Induction: Intravenous  PONV Risk Score and Plan: 2 and Ondansetron  and Dexamethasone   Airway Management Planned: Oral ETT  Additional Equipment: None  Intra-op Plan:   Post-operative Plan: Extubation in OR  Informed Consent: I have reviewed the patients History and Physical, chart, labs and discussed the procedure including the risks, benefits and alternatives for the proposed anesthesia with the patient or authorized representative who has indicated his/her understanding and acceptance.     Dental advisory given  Plan Discussed with: CRNA and Surgeon  Anesthesia Plan Comments:        Anesthesia Quick Evaluation

## 2023-09-08 ENCOUNTER — Encounter (HOSPITAL_COMMUNITY): Payer: Self-pay | Admitting: Otolaryngology

## 2023-09-11 ENCOUNTER — Encounter: Payer: Self-pay | Admitting: Women's Health

## 2024-02-25 ENCOUNTER — Other Ambulatory Visit: Payer: Self-pay | Admitting: Advanced Practice Midwife
